# Patient Record
Sex: Male | Born: 1955 | Race: White | Hispanic: No | Marital: Single | Smoking: Current every day smoker
Health system: Southern US, Community
[De-identification: ages and names within clinical notes are randomized; demographics above are authoritative.]

## PROBLEM LIST (undated history)

## (undated) DIAGNOSIS — F32A Depression, unspecified: Secondary | ICD-10-CM

## (undated) DIAGNOSIS — I639 Cerebral infarction, unspecified: Secondary | ICD-10-CM

## (undated) DIAGNOSIS — E785 Hyperlipidemia, unspecified: Secondary | ICD-10-CM

## (undated) DIAGNOSIS — F329 Major depressive disorder, single episode, unspecified: Secondary | ICD-10-CM

## (undated) DIAGNOSIS — J449 Chronic obstructive pulmonary disease, unspecified: Secondary | ICD-10-CM

## (undated) DIAGNOSIS — M549 Dorsalgia, unspecified: Secondary | ICD-10-CM

## (undated) DIAGNOSIS — R1310 Dysphagia, unspecified: Secondary | ICD-10-CM

## (undated) DIAGNOSIS — I10 Essential (primary) hypertension: Secondary | ICD-10-CM

## (undated) DIAGNOSIS — R269 Unspecified abnormalities of gait and mobility: Secondary | ICD-10-CM

## (undated) DIAGNOSIS — F0390 Unspecified dementia without behavioral disturbance: Secondary | ICD-10-CM

## (undated) HISTORY — PX: CHOLECYSTECTOMY: SHX55

---

## 2001-12-02 ENCOUNTER — Encounter: Payer: Self-pay | Admitting: Preventative Medicine

## 2001-12-02 ENCOUNTER — Ambulatory Visit (HOSPITAL_COMMUNITY): Admission: RE | Admit: 2001-12-02 | Discharge: 2001-12-02 | Payer: Self-pay | Admitting: Preventative Medicine

## 2003-04-11 ENCOUNTER — Emergency Department (HOSPITAL_COMMUNITY): Admission: EM | Admit: 2003-04-11 | Discharge: 2003-04-11 | Payer: Self-pay | Admitting: Internal Medicine

## 2004-11-20 ENCOUNTER — Ambulatory Visit (HOSPITAL_COMMUNITY): Admission: RE | Admit: 2004-11-20 | Discharge: 2004-11-20 | Payer: Self-pay | Admitting: Unknown Physician Specialty

## 2005-03-31 ENCOUNTER — Emergency Department (HOSPITAL_COMMUNITY): Admission: EM | Admit: 2005-03-31 | Discharge: 2005-03-31 | Payer: Self-pay | Admitting: Emergency Medicine

## 2006-10-02 ENCOUNTER — Ambulatory Visit (HOSPITAL_COMMUNITY): Admission: RE | Admit: 2006-10-02 | Discharge: 2006-10-02 | Payer: Self-pay | Admitting: Emergency Medicine

## 2012-11-10 ENCOUNTER — Other Ambulatory Visit: Payer: Self-pay | Admitting: Neurology

## 2012-11-10 DIAGNOSIS — R270 Ataxia, unspecified: Secondary | ICD-10-CM

## 2012-11-10 DIAGNOSIS — M4712 Other spondylosis with myelopathy, cervical region: Secondary | ICD-10-CM

## 2012-11-11 ENCOUNTER — Ambulatory Visit (HOSPITAL_COMMUNITY)
Admission: RE | Admit: 2012-11-11 | Discharge: 2012-11-11 | Disposition: A | Discharge status: Home / Self Care | Payer: Self-pay | Source: Ambulatory Visit | Attending: Neurology | Admitting: Neurology

## 2012-11-11 ENCOUNTER — Other Ambulatory Visit: Payer: Self-pay | Admitting: Neurology

## 2012-11-11 DIAGNOSIS — M546 Pain in thoracic spine: Secondary | ICD-10-CM | POA: Insufficient documentation

## 2012-11-11 DIAGNOSIS — M545 Low back pain, unspecified: Secondary | ICD-10-CM | POA: Insufficient documentation

## 2012-11-24 ENCOUNTER — Ambulatory Visit (HOSPITAL_COMMUNITY): Payer: Self-pay

## 2012-11-25 ENCOUNTER — Ambulatory Visit (HOSPITAL_COMMUNITY): Payer: Self-pay

## 2013-01-06 ENCOUNTER — Ambulatory Visit (HOSPITAL_COMMUNITY)
Admission: RE | Admit: 2013-01-06 | Discharge: 2013-01-06 | Disposition: A | Discharge status: Home / Self Care | Payer: Medicaid Other | Source: Ambulatory Visit | Attending: Neurology | Admitting: Neurology

## 2013-01-06 DIAGNOSIS — M542 Cervicalgia: Secondary | ICD-10-CM | POA: Diagnosis present

## 2013-01-06 DIAGNOSIS — M4802 Spinal stenosis, cervical region: Secondary | ICD-10-CM | POA: Insufficient documentation

## 2013-01-06 DIAGNOSIS — R270 Ataxia, unspecified: Secondary | ICD-10-CM

## 2013-01-06 DIAGNOSIS — M5380 Other specified dorsopathies, site unspecified: Secondary | ICD-10-CM | POA: Insufficient documentation

## 2013-01-06 DIAGNOSIS — M4712 Other spondylosis with myelopathy, cervical region: Secondary | ICD-10-CM

## 2013-05-03 ENCOUNTER — Encounter (HOSPITAL_COMMUNITY): Payer: Self-pay | Admitting: Emergency Medicine

## 2013-05-03 ENCOUNTER — Emergency Department (HOSPITAL_COMMUNITY)
Admission: EM | Admit: 2013-05-03 | Discharge: 2013-05-03 | Disposition: A | Discharge status: Home / Self Care | Payer: Medicare Other | Attending: Emergency Medicine | Admitting: Emergency Medicine

## 2013-05-03 ENCOUNTER — Emergency Department (HOSPITAL_COMMUNITY): Payer: Medicare Other

## 2013-05-03 DIAGNOSIS — M545 Low back pain, unspecified: Secondary | ICD-10-CM

## 2013-05-03 DIAGNOSIS — W19XXXA Unspecified fall, initial encounter: Secondary | ICD-10-CM | POA: Diagnosis not present

## 2013-05-03 DIAGNOSIS — F17200 Nicotine dependence, unspecified, uncomplicated: Secondary | ICD-10-CM | POA: Insufficient documentation

## 2013-05-03 DIAGNOSIS — S7000XA Contusion of unspecified hip, initial encounter: Secondary | ICD-10-CM | POA: Diagnosis not present

## 2013-05-03 DIAGNOSIS — IMO0002 Reserved for concepts with insufficient information to code with codable children: Secondary | ICD-10-CM | POA: Diagnosis present

## 2013-05-03 DIAGNOSIS — Y929 Unspecified place or not applicable: Secondary | ICD-10-CM | POA: Insufficient documentation

## 2013-05-03 DIAGNOSIS — S20229A Contusion of unspecified back wall of thorax, initial encounter: Secondary | ICD-10-CM | POA: Insufficient documentation

## 2013-05-03 DIAGNOSIS — Y939 Activity, unspecified: Secondary | ICD-10-CM | POA: Insufficient documentation

## 2013-05-03 HISTORY — DX: Dorsalgia, unspecified: M54.9

## 2013-05-03 MED ORDER — MELOXICAM 7.5 MG PO TABS: ORAL_TABLET | ORAL | Status: DC

## 2013-05-03 MED ORDER — BACLOFEN 10 MG PO TABS
10.0000 mg | ORAL_TABLET | Freq: Three times a day (TID) | ORAL | Status: AC
Start: 2013-05-03 — End: 2013-06-02

## 2013-05-03 MED ORDER — DEXAMETHASONE SODIUM PHOSPHATE 4 MG/ML IJ SOLN
8.0000 mg | Freq: Once | INTRAMUSCULAR | Status: AC
  Administered 2013-05-03: 8 mg via INTRAMUSCULAR
  Filled 2013-05-03: qty 2

## 2013-05-03 MED ORDER — DEXAMETHASONE 4 MG PO TABS: ORAL_TABLET | ORAL | Status: DC

## 2013-05-03 MED ORDER — KETOROLAC TROMETHAMINE 60 MG/2ML IM SOLN
60.0000 mg | Freq: Once | INTRAMUSCULAR | Status: AC
  Administered 2013-05-03: 60 mg via INTRAMUSCULAR
  Filled 2013-05-03: qty 2

## 2013-05-03 NOTE — ED Provider Notes (Signed)
CSN: [id number]     Arrival date & time 05/03/13  1633 History   First MD Initiated Contact with Patient 05/03/13 1814     Chief Complaint  Patient presents with  . Back Pain   (Consider location/radiation/quality/duration/timing/severity/associated sxs/prior Treatment) Patient is a 58 y.o. male presenting with back pain. The history is provided by the patient. 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  Back Pain Location:  Lumbar spine Quality:  Aching Pain severity:  Moderate Pain is:  Same all the time Onset quality:  Sudden Duration:  1 day Timing:  Unable to specify Progression:  Worsening Chronicity:  Chronic Context: falling and recent injury   Context comment:  Pt unable to explain Relieved by:  Nothing Worsened by:  Movement Associated symptoms: no abdominal pain, no bladder incontinence, no bowel incontinence, no chest pain, no dysuria and no fever     Past Medical History  Diagnosis Date  . Back pain    Past Surgical History  Procedure Laterality Date  . Cholecystectomy     No family history on file. History  Substance Use Topics  . Smoking status: Current Every Day Smoker  . Smokeless tobacco: Not on file  . Alcohol Use: No    Review of Systems  Constitutional: Negative for fever and activity change.       All ROS Neg except as noted in HPI  HENT: Negative for nosebleeds.   Eyes: Negative for photophobia and discharge.  Respiratory: Negative for cough, shortness of breath and wheezing.   Cardiovascular: Negative for chest pain and palpitations.  Gastrointestinal: Negative for abdominal pain, blood in stool and bowel incontinence.  Genitourinary: Negative for bladder incontinence, dysuria, frequency and hematuria.  Musculoskeletal: Positive for back pain. Negative for arthralgias and neck pain.  Skin: Negative.   Neurological: Negative for dizziness, seizures and speech difficulty.  Psychiatric/Behavioral: Negative for hallucinations and confusion.     Allergies  Review of patient's allergies indicates no known allergies.  Home Medications  No current outpatient prescriptions on file. BP 132/75  Pulse 78  Temp(Src) 98.4 F (36.9 C)  Resp 20  Ht 5\' 8"  (1.727 m)  Wt 141 lb (63.957 kg)  BMI 21.44 kg/m2  SpO2 98% Physical Exam  Nursing note and vitals reviewed. Constitutional: He is oriented to person, place, and time. He appears well-developed and well-nourished.  Non-toxic appearance.  HENT:  Head: Normocephalic.  Right Ear: Tympanic membrane and external ear normal.  Left Ear: Tympanic membrane and external ear normal.  Eyes: EOM and lids are normal. Pupils are equal, round, and reactive to light.  Neck: Normal range of motion. Neck supple. Carotid bruit is not present.  Cardiovascular: Normal rate, regular rhythm, normal heart sounds, intact distal pulses and normal pulses.   Pulmonary/Chest: Breath sounds normal. No respiratory distress.  Abdominal: Soft. Bowel sounds are normal. There is no tenderness. There is no guarding.  Musculoskeletal: He exhibits tenderness.       Lumbar back: He exhibits decreased range of motion, tenderness and pain.  Bruise to the right pack and hip area.   Lymphadenopathy:       Head (right side): No submandibular adenopathy present.       Head (left side): No submandibular adenopathy present.    He has no cervical adenopathy.  Neurological: He is alert and oriented to person, place, and time. No cranial nerve deficit or sensory deficit.  Slight decrease in motor tone of the lower extremities. Pt states this is not new.  Skin: Skin  is warm and dry.  Psychiatric: He has a normal mood and affect. His speech is normal.    ED Course  Procedures (including critical care time) Labs Review Labs Reviewed - No data to display Imaging Review No results found.  EKG Interpretation   None       MDM  No diagnosis found. **I have reviewed nursing notes, vital signs, and all appropriate lab  and imaging results for this patient.* Vital signs are stable. Pulse oximetry is 98% on room air. Patient has a speech impediment, and it is very difficult to understand him. The patient states however that he has been having back pain since yesterday. He's had this problem in the past.  No gross neurologic deficit appreciated. The patient walks with a cane, but this is not new.Bruise noted on the lowr=er back. Xray of the back and pelvis is neg for fx.Mild degenerative changes of the L4L5 area noted. Patient is treated with Decadron and Toradol in the emergency department. he's treated with baclofen, Decadro An, Mobic prescriptions. Patient is advised to see his physician for additional evaluation and management.   Kathie DikeHobson M Samhitha Rosen, PA-C 05/03/13 1837  Kathie DikeHobson M Solomon Skowronek, PA-C 05/06/13 1121

## 2013-05-03 NOTE — ED Notes (Signed)
Back pain onset yesterday. Patient is very difficult to understand due to problems with speech

## 2013-05-03 NOTE — Discharge Instructions (Signed)
Please call your doctor and set up an appointment for recheck. Please use medications as directed. Baclofen may cause drowsiness, please use with caution. Please take Decadron and Mobic with food. A heating pad to your lower back maybe helpful. Back Pain, Adult Back pain is very common. The pain often gets better over time. The cause of back pain is usually not dangerous. Most people can learn to manage their back pain on their own.  HOME CARE   Stay active. Start with short walks on flat ground if you can. Try to walk farther each day.  Do not sit, drive, or stand in one place for more than 30 minutes. Do not stay in bed.  Do not avoid exercise or work. Activity can help your back heal faster.  Be careful when you bend or lift an object. Bend at your knees, keep the object close to you, and do not twist.  Sleep on a firm mattress. Lie on your side, and bend your knees. If you lie on your back, put a pillow under your knees.  Only take medicines as told by your doctor.  Put ice on the injured area.  Put ice in a plastic bag.  Place a towel between your skin and the bag.  Leave the ice on for 15-20 minutes, 03-04 times a day for the first 2 to 3 days. After that, you can switch between ice and heat packs.  Ask your doctor about back exercises or massage.  Avoid feeling anxious or stressed. Find good ways to deal with stress, such as exercise. GET HELP RIGHT AWAY IF:   Your pain does not go away with rest or medicine.  Your pain does not go away in 1 week.  You have new problems.  You do not feel well.  The pain spreads into your legs.  You cannot control when you poop (bowel movement) or pee (urinate).  Your arms or legs feel weak or lose feeling (numbness).  You feel sick to your stomach (nauseous) or throw up (vomit).  You have belly (abdominal) pain.  You feel like you may pass out (faint). MAKE SURE YOU:   Understand these instructions.  Will watch your  condition.  Will get help right away if you are not doing well or get worse. Document Released: 09/17/2007 Document Revised: 06/23/2011 Document Reviewed: 08/19/2010 [facility] Patient Information 2014 [employer], [place].

## 2013-05-03 NOTE — ED Notes (Signed)
Pain  Low back , has contusions to rt buttock  And rt upper post thigh,  Says he fell

## 2013-05-06 NOTE — ED Provider Notes (Signed)
Medical screening examination/treatment/procedure(s) were performed by non-physician practitioner and as supervising physician I was immediately available for consultation/collaboration.  EKG Interpretation   None         Roselyn Doby L Pellegrino Kennard, MD 05/06/13 1506 

## 2013-09-29 ENCOUNTER — Emergency Department (HOSPITAL_COMMUNITY)
Admission: EM | Admit: 2013-09-29 | Discharge: 2013-09-29 | Disposition: A | Discharge status: Skilled Nursing Facility | Payer: Medicare Other | Attending: Emergency Medicine | Admitting: Emergency Medicine

## 2013-09-29 ENCOUNTER — Encounter (HOSPITAL_COMMUNITY): Payer: Self-pay | Admitting: Emergency Medicine

## 2013-09-29 ENCOUNTER — Emergency Department (HOSPITAL_COMMUNITY): Payer: Medicare Other

## 2013-09-29 DIAGNOSIS — W19XXXA Unspecified fall, initial encounter: Secondary | ICD-10-CM

## 2013-09-29 DIAGNOSIS — S0181XA Laceration without foreign body of other part of head, initial encounter: Secondary | ICD-10-CM

## 2013-09-29 DIAGNOSIS — Z79899 Other long term (current) drug therapy: Secondary | ICD-10-CM | POA: Diagnosis not present

## 2013-09-29 DIAGNOSIS — Z23 Encounter for immunization: Secondary | ICD-10-CM | POA: Insufficient documentation

## 2013-09-29 DIAGNOSIS — E785 Hyperlipidemia, unspecified: Secondary | ICD-10-CM | POA: Insufficient documentation

## 2013-09-29 DIAGNOSIS — F17200 Nicotine dependence, unspecified, uncomplicated: Secondary | ICD-10-CM | POA: Diagnosis not present

## 2013-09-29 DIAGNOSIS — S1093XA Contusion of unspecified part of neck, initial encounter: Secondary | ICD-10-CM

## 2013-09-29 DIAGNOSIS — Y9389 Activity, other specified: Secondary | ICD-10-CM | POA: Insufficient documentation

## 2013-09-29 DIAGNOSIS — S0003XA Contusion of scalp, initial encounter: Secondary | ICD-10-CM | POA: Insufficient documentation

## 2013-09-29 DIAGNOSIS — Y9210 Unspecified residential institution as the place of occurrence of the external cause: Secondary | ICD-10-CM | POA: Insufficient documentation

## 2013-09-29 DIAGNOSIS — Z8673 Personal history of transient ischemic attack (TIA), and cerebral infarction without residual deficits: Secondary | ICD-10-CM | POA: Diagnosis not present

## 2013-09-29 DIAGNOSIS — F0390 Unspecified dementia without behavioral disturbance: Secondary | ICD-10-CM | POA: Diagnosis not present

## 2013-09-29 DIAGNOSIS — Y92129 Unspecified place in nursing home as the place of occurrence of the external cause: Secondary | ICD-10-CM

## 2013-09-29 DIAGNOSIS — Z8659 Personal history of other mental and behavioral disorders: Secondary | ICD-10-CM | POA: Insufficient documentation

## 2013-09-29 DIAGNOSIS — R296 Repeated falls: Secondary | ICD-10-CM | POA: Diagnosis not present

## 2013-09-29 DIAGNOSIS — IMO0002 Reserved for concepts with insufficient information to code with codable children: Secondary | ICD-10-CM | POA: Diagnosis not present

## 2013-09-29 DIAGNOSIS — S0180XA Unspecified open wound of other part of head, initial encounter: Secondary | ICD-10-CM | POA: Insufficient documentation

## 2013-09-29 DIAGNOSIS — J449 Chronic obstructive pulmonary disease, unspecified: Secondary | ICD-10-CM | POA: Insufficient documentation

## 2013-09-29 DIAGNOSIS — S0083XA Contusion of other part of head, initial encounter: Secondary | ICD-10-CM | POA: Insufficient documentation

## 2013-09-29 DIAGNOSIS — J4489 Other specified chronic obstructive pulmonary disease: Secondary | ICD-10-CM | POA: Insufficient documentation

## 2013-09-29 HISTORY — DX: Unspecified dementia, unspecified severity, without behavioral disturbance, psychotic disturbance, mood disturbance, and anxiety: F03.90

## 2013-09-29 HISTORY — DX: Dysphagia, unspecified: R13.10

## 2013-09-29 HISTORY — DX: Cerebral infarction, unspecified: I63.9

## 2013-09-29 HISTORY — DX: Hyperlipidemia, unspecified: E78.5

## 2013-09-29 HISTORY — DX: Depression, unspecified: F32.A

## 2013-09-29 HISTORY — DX: Major depressive disorder, single episode, unspecified: F32.9

## 2013-09-29 HISTORY — DX: Unspecified abnormalities of gait and mobility: R26.9

## 2013-09-29 HISTORY — DX: Chronic obstructive pulmonary disease, unspecified: J44.9

## 2013-09-29 MED ORDER — TETANUS-DIPHTH-ACELL PERTUSSIS 5-2.5-18.5 LF-MCG/0.5 IM SUSP
0.5000 mL | Freq: Once | INTRAMUSCULAR | Status: AC
  Administered 2013-09-29: 0.5 mL via INTRAMUSCULAR
  Filled 2013-09-29: qty 0.5

## 2013-09-29 NOTE — ED Provider Notes (Signed)
  Medical screening examination/treatment/procedure(s) were performed by non-physician practitioner and as supervising physician I was immediately available for consultation/collaboration.   EKG Interpretation None            Gerhard Munchobert Lockwood, MD 09/29/13 1404

## 2013-09-29 NOTE — ED Notes (Addendum)
Pt. From [facility], got upset, threw walker, and fell, no LOC, laceration to right temple, no active bleeding,

## 2013-09-29 NOTE — ED Provider Notes (Signed)
CSN: [id number]     Arrival date & time 09/29/13  0941 History   First MD Initiated Contact with Patient 09/29/13 [phone]     Chief Complaint  Patient presents with  . Fall     (Consider location/radiation/quality/duration/timing/severity/associated sxs/prior Treatment) Patient is a 58 y.o. male presenting with fall. The history is provided by the EMS personnel, a caregiver and the patient. The history is limited by a language barrier (patient has had stroke and limited verbal communication).  Fall This is a new problem. The current episode started today. The problem has been unchanged. Nothing aggravates the symptoms. He has tried nothing for the symptoms.   Jason Cisneros is a 58 y.o. male who presents to the ED from [facility] after staff there state he got upset and threw his walker and fell. The patient says that the aid got in his way and caused him to fall.  He ha a laceration to the right temple and contusion to the right cheek. No LOC.  He denies any other injuries. Hx of CVA and speech is slurred but he does communicate using lap top. The nursing personnel with the patient states that the patient has a shuffle gait and when he gets upset it gets much worse. He was ambulatory after the fall.   Past Medical History  Diagnosis Date  . Back pain   . Dementia   . Hyperlipidemia   . Gait disturbance   . Depressive disorder    Past Surgical History  Procedure Laterality Date  . Cholecystectomy     History reviewed. No pertinent family history. History  Substance Use Topics  . Smoking status: Current Every Day Smoker -- 0.10 packs/day    Types: Cigarettes  . Smokeless tobacco: Not on file  . Alcohol Use: No    Review of Systems Negative except as stated in HPI   Allergies  Review of patient's allergies indicates no known allergies.  Home Medications   Prior to Admission medications   Medication Sig Start Date End Date Taking? Authorizing Provider  dexamethasone (DECADRON) 4  MG tablet 1 po bid with food 05/03/13   Kathie DikeHobson M Bryant, PA-C  lisinopril (PRINIVIL,ZESTRIL) 20 MG tablet Take 1 tablet by mouth daily. 04/16/13   Historical Provider, MD  meloxicam (MOBIC) 7.5 MG tablet 1 po bid with food 05/03/13   Kathie DikeHobson M Bryant, PA-C  pravastatin (PRAVACHOL) 40 MG tablet Take 1 tablet by mouth daily. 04/16/13   Historical Provider, MD  SYMBICORT 80-4.5 MCG/ACT inhaler Inhale 2 puffs into the lungs 2 (two) times daily.  04/16/13   Historical Provider, MD   BP 163/84  Pulse 99  Temp(Src) 98.6 F (37 C) (Oral)  Resp 36  SpO2 99% Physical Exam  Nursing note and vitals reviewed. Constitutional: He is oriented to person, place, and time. He appears well-developed and well-nourished. No distress.  HENT:  Head:    Right Ear: Tympanic membrane normal.  Left Ear: Tympanic membrane normal.  Mouth/Throat: Uvula is midline, oropharynx is clear and moist and mucous membranes are normal.  Contusion to right cheek.  Eyes: Conjunctivae and EOM are normal. Pupils are equal, round, and reactive to light.  Neck: Normal range of motion. Neck supple.  Pulmonary/Chest: Effort normal.  Abdominal: Soft. There is no tenderness.  Musculoskeletal: Normal range of motion.  Neurological: He is alert and oriented to person, place, and time. He has normal strength. No cranial nerve deficit or sensory deficit. Gait (but baseline for the patient) abnormal.  Good grips and equal bilateral.  Skin: Skin is warm and dry.  Psychiatric: He has a normal mood and affect. His behavior is normal.    ED Course  Procedures  LACERATION REPAIR Performed by: NEESE,HOPE Authorized by: NEESE,HOPE Consent: Verbal consent obtained. Risks and benefits: risks, benefits and alternatives were discussed Consent given by: patient  Patient identity confirmed: provided demographic data  Wound explored  Laceration Location: right temporal area  Laceration Length: 1.5 cm  No Foreign Bodies seen or  palpated  Irrigation method: syringe Amount of cleaning: standard  Skin closure: Dermabond  Patient tolerance: Patient tolerated the procedure well with no immediate complications.   Ct Maxillofacial Wo Cm  09/29/2013   CLINICAL DATA:  Fall, no loss of consciousness, RIGHT temporal laceration, history COPD, stroke, dementia  EXAM: CT MAXILLOFACIAL WITHOUT CONTRAST  TECHNIQUE: Multidetector CT imaging of the maxillofacial structures was performed. Multiplanar CT image reconstructions were also generated. A small metallic BB was placed on the right temple in order to reliably differentiate right from left.  COMPARISON:  08/02/2013 CT head  FINDINGS: Generalized atrophy.  Normal ventricular morphology.  No midline shift or mass effect.  Small old high RIGHT parietal infarct.  Scattered atherosclerotic calcifications.  Prior RIGHT mastoid surgery.  LEFT middle ear cavity clear.  Remaining paranasal sinuses clear.  Nasal septal deviation to the RIGHT.  No facial bone fractures identified.  Intraorbital soft tissue planes clear.  IMPRESSION: No facial bone fractures identified.  Small old RIGHT parietal infarct.   Electronically Signed   By: Ulyses SouthwardMark  Boles M.D.   On: 09/29/2013 11:21     MDM  58 y.o. male with laceration and contusion to the right side of his face s/p fall. Stable for discharge without any new neurological deficits. Chronic COPD, demintia, gait disturbance, CVA and dysphagia. He is here with nursing staff from the nursing home. Discussed plan of care with care giver. They will return for any problems.     Janne NapoleonHope M Neese, [place] 09/29/13 1235

## 2013-12-10 ENCOUNTER — Encounter (HOSPITAL_COMMUNITY): Payer: Self-pay | Admitting: Emergency Medicine

## 2013-12-10 ENCOUNTER — Emergency Department (HOSPITAL_COMMUNITY)
Admission: EM | Admit: 2013-12-10 | Discharge: 2013-12-10 | Disposition: A | Discharge status: Home / Self Care | Payer: Medicare Other | Attending: Emergency Medicine | Admitting: Emergency Medicine

## 2013-12-10 DIAGNOSIS — Z79899 Other long term (current) drug therapy: Secondary | ICD-10-CM | POA: Diagnosis not present

## 2013-12-10 DIAGNOSIS — IMO0002 Reserved for concepts with insufficient information to code with codable children: Secondary | ICD-10-CM | POA: Diagnosis not present

## 2013-12-10 DIAGNOSIS — J4489 Other specified chronic obstructive pulmonary disease: Secondary | ICD-10-CM | POA: Insufficient documentation

## 2013-12-10 DIAGNOSIS — Y9389 Activity, other specified: Secondary | ICD-10-CM | POA: Insufficient documentation

## 2013-12-10 DIAGNOSIS — F17200 Nicotine dependence, unspecified, uncomplicated: Secondary | ICD-10-CM | POA: Insufficient documentation

## 2013-12-10 DIAGNOSIS — T189XXA Foreign body of alimentary tract, part unspecified, initial encounter: Secondary | ICD-10-CM | POA: Insufficient documentation

## 2013-12-10 DIAGNOSIS — E785 Hyperlipidemia, unspecified: Secondary | ICD-10-CM | POA: Diagnosis not present

## 2013-12-10 DIAGNOSIS — F329 Major depressive disorder, single episode, unspecified: Secondary | ICD-10-CM | POA: Diagnosis not present

## 2013-12-10 DIAGNOSIS — Z7982 Long term (current) use of aspirin: Secondary | ICD-10-CM | POA: Diagnosis not present

## 2013-12-10 DIAGNOSIS — F0390 Unspecified dementia without behavioral disturbance: Secondary | ICD-10-CM | POA: Insufficient documentation

## 2013-12-10 DIAGNOSIS — Z791 Long term (current) use of non-steroidal anti-inflammatories (NSAID): Secondary | ICD-10-CM | POA: Diagnosis not present

## 2013-12-10 DIAGNOSIS — Z8679 Personal history of other diseases of the circulatory system: Secondary | ICD-10-CM | POA: Diagnosis not present

## 2013-12-10 DIAGNOSIS — F3289 Other specified depressive episodes: Secondary | ICD-10-CM | POA: Insufficient documentation

## 2013-12-10 DIAGNOSIS — Y929 Unspecified place or not applicable: Secondary | ICD-10-CM | POA: Diagnosis not present

## 2013-12-10 DIAGNOSIS — J449 Chronic obstructive pulmonary disease, unspecified: Secondary | ICD-10-CM | POA: Insufficient documentation

## 2013-12-10 LAB — PROTIME-INR
INR: 1.08 (ref 0.00–1.49)
Prothrombin Time: 14 seconds (ref 11.6–15.2)

## 2013-12-10 MED ORDER — IPRATROPIUM-ALBUTEROL 0.5-2.5 (3) MG/3ML IN SOLN
3.0000 mL | Freq: Once | RESPIRATORY_TRACT | Status: AC
  Administered 2013-12-10: 3 mL via RESPIRATORY_TRACT
  Filled 2013-12-10: qty 3

## 2013-12-10 NOTE — ED Provider Notes (Signed)
CSN: [id number]     Arrival date & time    History   First MD Initiated Contact with Patient 12/10/13 2207     Chief Complaint  Patient presents with  . Poisoning     (Consider location/radiation/quality/duration/timing/severity/associated sxs/prior Treatment) HPI Bay Wayson Nokes is a 58 y.o. male who presents to the ED from [facility] after staff there was concerned that the patient may have eaten a rat at approximately 9 am. The staff received the report from house keeping that the patient had something in his mouth that looked like it had a tail. Patient denies eating a rat. He states that it was a piece of bread. The night shift at [facility] decided to send the patient to the ED for coagulation levels to be safe. The staff person here with the patient reports that the patient is acting his usual self.   Past Medical History  Diagnosis Date  . Back pain   . Dementia   . Hyperlipidemia   . Gait disturbance   . Depressive disorder   . COPD (chronic obstructive pulmonary disease)   . Dysphagia   . CVA (cerebral infarction)    Past Surgical History  Procedure Laterality Date  . Cholecystectomy     No family history on file. History  Substance Use Topics  . Smoking status: Current Every Day Smoker -- 0.10 packs/day    Types: Cigarettes  . Smokeless tobacco: Not on file  . Alcohol Use: No    Review of Systems Negative except as stated in HPI   Allergies  Review of patient's allergies indicates no known allergies.  Home Medications   Prior to Admission medications   Medication Sig Start Date End Date Taking? Authorizing Provider  amLODipine (NORVASC) 5 MG tablet Take 1 tablet by mouth daily. 07/25/13  Yes Historical Provider, MD  aspirin EC 81 MG tablet Take 81 mg by mouth daily. For status post cva.   Yes Historical Provider, MD  atorvastatin (LIPITOR) 20 MG tablet Take 20 mg by mouth every evening.   Yes Historical Provider, MD  baclofen (LIORESAL) 20 MG tablet Take 20 mg by  mouth 2 (two) times daily. For gait disturbances,muscle spasm   Yes Historical Provider, MD  cholecalciferol (VITAMIN D) 1000 UNITS tablet Take 1,000 Units by mouth daily.   Yes Historical Provider, MD  ipratropium-albuterol (DUONEB) 0.5-2.5 (3) MG/3ML SOLN Take 3 mLs by nebulization 3 (three) times daily. For COPD.   Yes Historical Provider, MD  lisinopril (PRINIVIL,ZESTRIL) 20 MG tablet Take 1 tablet by mouth daily. 04/16/13  Yes Historical Provider, MD  LORazepam (ATIVAN) 0.5 MG tablet Take 0.5 mg by mouth every 8 (eight) hours as needed for anxiety.   Yes Historical Provider, MD  mirtazapine (REMERON) 30 MG tablet Take 30 mg by mouth at bedtime.   Yes Historical Provider, MD  OXcarbazepine (TRILEPTAL) 150 MG tablet Take 150 mg by mouth 2 (two) times daily.   Yes Historical Provider, MD  tiotropium (SPIRIVA) 18 MCG inhalation capsule Place 18 mcg into inhaler and inhale daily.   Yes Historical Provider, MD  ipratropium-albuterol (DUONEB) 0.5-2.5 (3) MG/3ML SOLN Take 3 mLs by nebulization every 6 (six) hours as needed (for shortness of breath or wheezing.).    Historical Provider, MD  meloxicam (MOBIC) 15 MG tablet Take 15 mg by mouth 2 (two) times daily. For gout.    Historical Provider, MD   BP 156/87  Pulse 77  Temp(Src) 98.6 F (37 C) (Oral)  Resp 20  Ht  (1.727 m)  Wt 133 lb 4.8 oz (60.464 kg)  BMI 20.27 kg/m2  SpO2 94% Physical Exam  Nursing note and vitals reviewed. Constitutional: He is oriented to person, place, and time. He appears well-developed and well-nourished. No distress.  HENT:  Head: Normocephalic and atraumatic.  Eyes: EOM are normal.  Neck: Neck supple.  Cardiovascular: Normal rate and regular rhythm.   Pulmonary/Chest: Effort normal. No respiratory distress.  Abdominal: Soft. Bowel sounds are normal. There is no tenderness.  Musculoskeletal: Normal range of motion.  Neurological: He is alert and oriented to person, place, and time. No cranial nerve deficit.   Skin: Skin is warm and dry.    ED Course  Procedures (including critical care time) Labs Review Labs Reviewed  PROTIME-INR   Results for orders placed during the hospital encounter of 12/10/13 (from the past 24 hour(s))  PROTIME-INR     Status: None   Collection Time    12/10/13  9:20 PM      Result Value Ref Range   Prothrombin Time 14.0  11.6 - 15.2 seconds   INR 1.08  0.00 - 1.49      MDM  58 y.o. male brought to the ED after possible ingesting a rat. Patient has been his normal base line since the possible ingestion per staff from [facility] with the patient. Stable for discharge without any acute findings, normal PT. Will d/c home.  Discussed with the staff member from [facility] findings and plan of care. She voices understanding. All questioned fully answered. He will return if any problems arise.     Janne Napoleon, [place] 12/10/13 2251

## 2013-12-10 NOTE — Discharge Instructions (Signed)
The lab work tonight was normal.  Results for orders placed during the hospital encounter of 12/10/13 (from the past 24 hour(s))  PROTIME-INR     Status: None   Collection Time    12/10/13  9:20 PM      Result Value Ref Range   Prothrombin Time 14.0  11.6 - 15.2 seconds   INR 1.08  0.00 - 1.49   if the patient develops any problems return to the ED.

## 2013-12-10 NOTE — ED Notes (Signed)
Morning staff @ [facility] concerned that patient may have eaten a mouse @ 9am. This is  rumor at this point,  Pt denies same. Night shift staff decided to send him in for coagulation levels to be safe

## 2013-12-11 NOTE — ED Provider Notes (Signed)
Medical screening examination/treatment/procedure(s) were performed by non-physician practitioner and as supervising physician I was immediately available for consultation/collaboration.   EKG Interpretation None        Marjie Chea L Ollivander See, MD 12/11/13 1737 

## 2013-12-26 ENCOUNTER — Encounter (HOSPITAL_COMMUNITY): Payer: Self-pay | Admitting: Emergency Medicine

## 2013-12-26 ENCOUNTER — Emergency Department (HOSPITAL_COMMUNITY)
Admission: EM | Admit: 2013-12-26 | Discharge: 2013-12-27 | Disposition: A | Discharge status: Home / Self Care | Payer: Medicare Other | Attending: Emergency Medicine | Admitting: Emergency Medicine

## 2013-12-26 DIAGNOSIS — E785 Hyperlipidemia, unspecified: Secondary | ICD-10-CM | POA: Diagnosis not present

## 2013-12-26 DIAGNOSIS — Y9289 Other specified places as the place of occurrence of the external cause: Secondary | ICD-10-CM | POA: Insufficient documentation

## 2013-12-26 DIAGNOSIS — F3289 Other specified depressive episodes: Secondary | ICD-10-CM | POA: Insufficient documentation

## 2013-12-26 DIAGNOSIS — Z791 Long term (current) use of non-steroidal anti-inflammatories (NSAID): Secondary | ICD-10-CM | POA: Diagnosis not present

## 2013-12-26 DIAGNOSIS — J4489 Other specified chronic obstructive pulmonary disease: Secondary | ICD-10-CM | POA: Insufficient documentation

## 2013-12-26 DIAGNOSIS — Z7982 Long term (current) use of aspirin: Secondary | ICD-10-CM | POA: Diagnosis not present

## 2013-12-26 DIAGNOSIS — Y9389 Activity, other specified: Secondary | ICD-10-CM | POA: Diagnosis not present

## 2013-12-26 DIAGNOSIS — S0993XA Unspecified injury of face, initial encounter: Secondary | ICD-10-CM | POA: Diagnosis present

## 2013-12-26 DIAGNOSIS — J449 Chronic obstructive pulmonary disease, unspecified: Secondary | ICD-10-CM | POA: Insufficient documentation

## 2013-12-26 DIAGNOSIS — W010XXA Fall on same level from slipping, tripping and stumbling without subsequent striking against object, initial encounter: Secondary | ICD-10-CM | POA: Insufficient documentation

## 2013-12-26 DIAGNOSIS — Z8673 Personal history of transient ischemic attack (TIA), and cerebral infarction without residual deficits: Secondary | ICD-10-CM | POA: Insufficient documentation

## 2013-12-26 DIAGNOSIS — S01112A Laceration without foreign body of left eyelid and periocular area, initial encounter: Secondary | ICD-10-CM

## 2013-12-26 DIAGNOSIS — Z79899 Other long term (current) drug therapy: Secondary | ICD-10-CM | POA: Diagnosis not present

## 2013-12-26 DIAGNOSIS — F329 Major depressive disorder, single episode, unspecified: Secondary | ICD-10-CM | POA: Diagnosis not present

## 2013-12-26 DIAGNOSIS — F0390 Unspecified dementia without behavioral disturbance: Secondary | ICD-10-CM | POA: Insufficient documentation

## 2013-12-26 DIAGNOSIS — S199XXA Unspecified injury of neck, initial encounter: Secondary | ICD-10-CM

## 2013-12-26 DIAGNOSIS — S0180XA Unspecified open wound of other part of head, initial encounter: Secondary | ICD-10-CM | POA: Diagnosis not present

## 2013-12-26 DIAGNOSIS — F17200 Nicotine dependence, unspecified, uncomplicated: Secondary | ICD-10-CM | POA: Insufficient documentation

## 2013-12-26 NOTE — ED Provider Notes (Signed)
CSN: [id number]     Arrival date & time    History   First MD Initiated Contact with Patient 12/26/13 2339     This chart was scribed for Donnetta Hutching, MD by Tonye Royalty, ED Scribe. This patient was seen in room APA14/APA14 and the patient's care was started at 11:42 PM.   Chief Complaint  Patient presents with  . Fall   The history is provided by the patient. The history is limited by the condition of the patient. No language interpreter was used.    HPI Comments: Level V caveat secondary to patient being nonverbal Jason Cisneros is a 58 y.o. male who presents to the Emergency Department complaining of laceration to left eyebrow after tripping over his nebulizer tubing and falling. He reports history of stroke; he comprehends speech but uses a computer to communicate. No other injuries  Past Medical History  Diagnosis Date  . Back pain   . Dementia   . Hyperlipidemia   . Gait disturbance   . Depressive disorder   . COPD (chronic obstructive pulmonary disease)   . Dysphagia   . CVA (cerebral infarction)    Past Surgical History  Procedure Laterality Date  . Cholecystectomy     History reviewed. No pertinent family history. History  Substance Use Topics  . Smoking status: Current Every Day Smoker -- 0.10 packs/day    Types: Cigarettes  . Smokeless tobacco: Not on file  . Alcohol Use: No    Review of Systems  Unable to perform ROS: Patient nonverbal  Level 5 caveat due to inability to speak   Allergies  Review of patient's allergies indicates no known allergies.  Home Medications   Prior to Admission medications   Medication Sig Start Date End Date Taking? Authorizing Provider  amLODipine (NORVASC) 5 MG tablet Take 1 tablet by mouth daily. 07/25/13   Historical Provider, MD  aspirin EC 81 MG tablet Take 81 mg by mouth daily. For status post cva.    Historical Provider, MD  atorvastatin (LIPITOR) 20 MG tablet Take 20 mg by mouth every evening.    Historical Provider, MD   baclofen (LIORESAL) 20 MG tablet Take 20 mg by mouth 2 (two) times daily. For gait disturbances,muscle spasm    Historical Provider, MD  cholecalciferol (VITAMIN D) 1000 UNITS tablet Take 1,000 Units by mouth daily.    Historical Provider, MD  ipratropium-albuterol (DUONEB) 0.5-2.5 (3) MG/3ML SOLN Take 3 mLs by nebulization 3 (three) times daily. For COPD.    Historical Provider, MD  ipratropium-albuterol (DUONEB) 0.5-2.5 (3) MG/3ML SOLN Take 3 mLs by nebulization every 6 (six) hours as needed (for shortness of breath or wheezing.).    Historical Provider, MD  lisinopril (PRINIVIL,ZESTRIL) 20 MG tablet Take 1 tablet by mouth daily. 04/16/13   Historical Provider, MD  LORazepam (ATIVAN) 0.5 MG tablet Take 0.5 mg by mouth every 8 (eight) hours as needed for anxiety.    Historical Provider, MD  meloxicam (MOBIC) 15 MG tablet Take 15 mg by mouth 2 (two) times daily. For gout.    Historical Provider, MD  mirtazapine (REMERON) 30 MG tablet Take 30 mg by mouth at bedtime.    Historical Provider, MD  OXcarbazepine (TRILEPTAL) 150 MG tablet Take 150 mg by mouth 2 (two) times daily.    Historical Provider, MD  tiotropium (SPIRIVA) 18 MCG inhalation capsule Place 18 mcg into inhaler and inhale daily.    Historical Provider, MD   BP 174/68  Pulse 64  Temp(Src) 98.4 F (36.9 C) (Oral)  Resp 20  Ht  (1.6 m)  Wt 140 lb (63.504 kg)  BMI 24.81 kg/m2  SpO2 94% Physical Exam  Nursing note and vitals reviewed. Constitutional: He is oriented to person, place, and time. He appears well-developed and well-nourished.  HENT:  Head: Normocephalic and atraumatic.  1.5 cm oblique laceration over left lateral eyebrow  Eyes: Conjunctivae and EOM are normal. Pupils are equal, round, and reactive to light.  Neck: Normal range of motion. Neck supple.  Cardiovascular: Normal rate, regular rhythm and normal heart sounds.   Pulmonary/Chest: Effort normal and breath sounds normal.  Abdominal: Soft. Bowel sounds are  normal.  Musculoskeletal: Normal range of motion.  Neurological: He is alert and oriented to person, place, and time.  Unable to talk and communicates via a computer  Skin: Skin is warm and dry.  Psychiatric: He has a normal mood and affect. His behavior is normal.    ED Course  Procedures (including critical care time) Labs Review Labs Reviewed - No data to display  Imaging Review No results found.   EKG Interpretation None     DIAGNOSTIC STUDIES: Oxygen Saturation is 94% on room air, adequate by my interpretation.    COORDINATION OF CARE: Will apply Steri-Strips to the laceration on his forehead.    MDM   Final diagnoses:  Laceration of left eyebrow, initial encounter   patient is in no acute distress. He has a small oblique laceration to his left lateral eyebrow. Wound was cleaned and Steri-Strips applied. No change in behavior. No imaging warranted  I personally performed the services described in this documentation, which was scribed in my presence. The recorded information has been reviewed and is accurate.   Donnetta Hutching, MD 12/27/13 [phone]

## 2013-12-26 NOTE — ED Notes (Signed)
Patient tripped over nebulizer and fell. Hit his head, has a laceration to head. Patient has head bandaged.

## 2013-12-27 NOTE — Discharge Instructions (Signed)
Keep Steri-Strips on for several days.

## 2014-01-02 ENCOUNTER — Encounter (HOSPITAL_COMMUNITY): Payer: Self-pay | Admitting: Emergency Medicine

## 2014-01-02 ENCOUNTER — Emergency Department (HOSPITAL_COMMUNITY): Payer: Medicare Other

## 2014-01-02 ENCOUNTER — Inpatient Hospital Stay (HOSPITAL_COMMUNITY)
Admission: EM | Admit: 2014-01-02 | Discharge: 2014-01-06 | DRG: 190 | Disposition: A | Discharge status: Skilled Nursing Facility | Payer: Medicare Other | Attending: Internal Medicine | Admitting: Internal Medicine

## 2014-01-02 DIAGNOSIS — T17928D Food in respiratory tract, part unspecified causing other injury, subsequent encounter: Secondary | ICD-10-CM

## 2014-01-02 DIAGNOSIS — T17920A Food in respiratory tract, part unspecified causing asphyxiation, initial encounter: Secondary | ICD-10-CM | POA: Diagnosis present

## 2014-01-02 DIAGNOSIS — Z993 Dependence on wheelchair: Secondary | ICD-10-CM

## 2014-01-02 DIAGNOSIS — I69959 Hemiplegia and hemiparesis following unspecified cerebrovascular disease affecting unspecified side: Secondary | ICD-10-CM

## 2014-01-02 DIAGNOSIS — E872 Acidosis, unspecified: Secondary | ICD-10-CM | POA: Diagnosis present

## 2014-01-02 DIAGNOSIS — J9602 Acute respiratory failure with hypercapnia: Secondary | ICD-10-CM

## 2014-01-02 DIAGNOSIS — T17920D Food in respiratory tract, part unspecified causing asphyxiation, subsequent encounter: Secondary | ICD-10-CM

## 2014-01-02 DIAGNOSIS — J9600 Acute respiratory failure, unspecified whether with hypoxia or hypercapnia: Secondary | ICD-10-CM | POA: Diagnosis present

## 2014-01-02 DIAGNOSIS — J9601 Acute respiratory failure with hypoxia: Secondary | ICD-10-CM | POA: Diagnosis present

## 2014-01-02 DIAGNOSIS — G1221 Amyotrophic lateral sclerosis: Secondary | ICD-10-CM | POA: Diagnosis present

## 2014-01-02 DIAGNOSIS — T17928A Food in respiratory tract, part unspecified causing other injury, initial encounter: Secondary | ICD-10-CM | POA: Diagnosis present

## 2014-01-02 DIAGNOSIS — R739 Hyperglycemia, unspecified: Secondary | ICD-10-CM | POA: Diagnosis present

## 2014-01-02 DIAGNOSIS — E785 Hyperlipidemia, unspecified: Secondary | ICD-10-CM | POA: Diagnosis present

## 2014-01-02 DIAGNOSIS — F17200 Nicotine dependence, unspecified, uncomplicated: Secondary | ICD-10-CM | POA: Diagnosis present

## 2014-01-02 DIAGNOSIS — D696 Thrombocytopenia, unspecified: Secondary | ICD-10-CM | POA: Diagnosis present

## 2014-01-02 DIAGNOSIS — J441 Chronic obstructive pulmonary disease with (acute) exacerbation: Principal | ICD-10-CM | POA: Diagnosis present

## 2014-01-02 DIAGNOSIS — Z8673 Personal history of transient ischemic attack (TIA), and cerebral infarction without residual deficits: Secondary | ICD-10-CM

## 2014-01-02 DIAGNOSIS — F482 Pseudobulbar affect: Secondary | ICD-10-CM | POA: Diagnosis present

## 2014-01-02 DIAGNOSIS — R1310 Dysphagia, unspecified: Secondary | ICD-10-CM | POA: Diagnosis present

## 2014-01-02 DIAGNOSIS — F0390 Unspecified dementia without behavioral disturbance: Secondary | ICD-10-CM | POA: Diagnosis present

## 2014-01-02 DIAGNOSIS — I69991 Dysphagia following unspecified cerebrovascular disease: Secondary | ICD-10-CM

## 2014-01-02 DIAGNOSIS — Z66 Do not resuscitate: Secondary | ICD-10-CM | POA: Diagnosis present

## 2014-01-02 DIAGNOSIS — R0603 Acute respiratory distress: Secondary | ICD-10-CM

## 2014-01-02 DIAGNOSIS — I10 Essential (primary) hypertension: Secondary | ICD-10-CM | POA: Diagnosis present

## 2014-01-02 DIAGNOSIS — E870 Hyperosmolality and hypernatremia: Secondary | ICD-10-CM | POA: Diagnosis present

## 2014-01-02 DIAGNOSIS — R0609 Other forms of dyspnea: Secondary | ICD-10-CM | POA: Diagnosis not present

## 2014-01-02 HISTORY — DX: Essential (primary) hypertension: I10

## 2014-01-02 HISTORY — DX: Cerebral infarction, unspecified: I63.9

## 2014-01-02 LAB — CBC WITH DIFFERENTIAL/PLATELET
Basophils Absolute: 0 10*3/uL (ref 0.0–0.1)
Basophils Relative: 0 % (ref 0–1)
Eosinophils Absolute: 0 10*3/uL (ref 0.0–0.7)
Eosinophils Relative: 0 % (ref 0–5)
HEMATOCRIT: 49.8 % (ref 39.0–52.0)
HEMOGLOBIN: 16.3 g/dL (ref 13.0–17.0)
LYMPHS PCT: 10 % — AB (ref 12–46)
Lymphs Abs: 0.9 10*3/uL (ref 0.7–4.0)
MCH: 30.4 pg (ref 26.0–34.0)
MCHC: 32.7 g/dL (ref 30.0–36.0)
MCV: 92.7 fL (ref 78.0–100.0)
MONOS PCT: 1 % — AB (ref 3–12)
Monocytes Absolute: 0.1 10*3/uL (ref 0.1–1.0)
NEUTROS ABS: 7.8 10*3/uL — AB (ref 1.7–7.7)
NEUTROS PCT: 89 % — AB (ref 43–77)
Platelets: 170 10*3/uL (ref 150–400)
RBC: 5.37 MIL/uL (ref 4.22–5.81)
RDW: 13.2 % (ref 11.5–15.5)
WBC: 8.8 10*3/uL (ref 4.0–10.5)

## 2014-01-02 MED ORDER — ETOMIDATE 2 MG/ML IV SOLN
INTRAVENOUS | Status: AC
  Filled 2014-01-02: qty 20

## 2014-01-02 MED ORDER — LIDOCAINE HCL (CARDIAC) 20 MG/ML IV SOLN
INTRAVENOUS | Status: AC
  Filled 2014-01-02: qty 5

## 2014-01-02 MED ORDER — METHYLPREDNISOLONE SODIUM SUCC 125 MG IJ SOLR
80.0000 mg | Freq: Once | INTRAMUSCULAR | Status: DC
  Filled 2014-01-02: qty 2

## 2014-01-02 MED ORDER — ROCURONIUM BROMIDE 50 MG/5ML IV SOLN
INTRAVENOUS | Status: AC
  Filled 2014-01-02: qty 2

## 2014-01-02 MED ORDER — SUCCINYLCHOLINE CHLORIDE 20 MG/ML IJ SOLN
INTRAMUSCULAR | Status: AC
  Filled 2014-01-02: qty 1

## 2014-01-02 NOTE — ED Notes (Signed)
resp distress, pt from [facility] who reportedly started sob for the past several hours, given 125 solumedrol and neb treatments without relief

## 2014-01-02 NOTE — ED Provider Notes (Addendum)
CSN: [id number]     Arrival date & time 01/02/14  2334 History  This chart was scribed for Jason Baton, MD by Freida Busman, ED Scribe. This patient was seen in room APA02/APA02 and the patient's care was started 11:35 PM.    Chief Complaint  Patient presents with  . Respiratory Distress  Level 5 caveat: Unable to fully obtain HPI/ROS due to acuity of medical condition.  No language interpreter was used.    HPI Comments:  Jason Cisneros is a 58 y.o. male with a h/o COPD  who presents to the Emergency Department brought in by ambulance for severe trouble breathing. Pt was placed on CPAP en route with no relief.  Per EMS report, patient was given Solu-Medrol and Xopenex at his living facility earlier today without improvement.  Upon arrival, patient was satting in the low 70s and was having difficulty breathing. He is placed on CPAP with improvement of his oxygenation into the low 80s. Patient is nonverbal at baseline and communicates with a computer.  No reported fevers.   Past Medical History  Diagnosis Date  . Back pain   . Dementia   . Hyperlipidemia   . Gait disturbance   . Depressive disorder   . COPD (chronic obstructive pulmonary disease)   . Dysphagia   . CVA (cerebral infarction)    Past Surgical History  Procedure Laterality Date  . Cholecystectomy     No family history on file. History  Substance Use Topics  . Smoking status: Current Every Day Smoker -- 0.10 packs/day    Types: Cigarettes  . Smokeless tobacco: Not on file  . Alcohol Use: No    Review of Systems  Unable to perform ROS: Acuity of condition      Allergies  Review of patient's allergies indicates no known allergies.  Home Medications   Prior to Admission medications   Medication Sig Start Date End Date Taking? Authorizing Provider  amLODipine (NORVASC) 5 MG tablet Take 1 tablet by mouth daily. 07/25/13   Historical Provider, MD  aspirin EC 81 MG tablet Take 81 mg by mouth daily. For  status post cva.    Historical Provider, MD  atorvastatin (LIPITOR) 20 MG tablet Take 20 mg by mouth every evening.    Historical Provider, MD  baclofen (LIORESAL) 20 MG tablet Take 20 mg by mouth 2 (two) times daily. For gait disturbances,muscle spasm    Historical Provider, MD  cholecalciferol (VITAMIN D) 1000 UNITS tablet Take 1,000 Units by mouth daily.    Historical Provider, MD  ipratropium-albuterol (DUONEB) 0.5-2.5 (3) MG/3ML SOLN Take 3 mLs by nebulization 3 (three) times daily. For COPD.    Historical Provider, MD  ipratropium-albuterol (DUONEB) 0.5-2.5 (3) MG/3ML SOLN Take 3 mLs by nebulization every 6 (six) hours as needed (for shortness of breath or wheezing.).    Historical Provider, MD  lisinopril (PRINIVIL,ZESTRIL) 20 MG tablet Take 1 tablet by mouth daily. 04/16/13   Historical Provider, MD  LORazepam (ATIVAN) 0.5 MG tablet Take 0.5 mg by mouth every 8 (eight) hours as needed for anxiety.    Historical Provider, MD  meloxicam (MOBIC) 15 MG tablet Take 15 mg by mouth 2 (two) times daily. For gout.    Historical Provider, MD  mirtazapine (REMERON) 30 MG tablet Take 30 mg by mouth at bedtime.    Historical Provider, MD  OXcarbazepine (TRILEPTAL) 150 MG tablet Take 150 mg by mouth 2 (two) times daily.    Historical Provider, MD  tiotropium (  SPIRIVA) 18 MCG inhalation capsule Place 18 mcg into inhaler and inhale daily.    Historical Provider, MD   BP 193/116  Pulse 120  Temp(Src) 98.4 F (36.9 C) (Rectal)  Resp 30  Ht  (1.6 m)  Wt 140 lb (63.504 kg)  BMI 24.81 kg/m2  SpO2 97% Physical Exam  Nursing note and vitals reviewed. Constitutional:  Appears older than stated age, chronically ill-appearing, acute respiratory distress with CPAP in place  HENT:  Head: Normocephalic and atraumatic.  Eyes: Pupils are equal, round, and reactive to light.  Neck: Neck supple. No JVD present.  Cardiovascular: Regular rhythm and normal heart sounds.   No murmur heard. Tachycardia   Pulmonary/Chest: He is in respiratory distress. He has no wheezes. He has rales.  Increased work of breathing, tachypnea, poor air movement with coarse Rales bilaterally  Abdominal: Soft. Bowel sounds are normal. There is no tenderness. There is no rebound.  Musculoskeletal: He exhibits no edema.  Neurological: He is alert.  Shake his head appropriately to yes and no questions  Skin: Skin is warm and dry.  Psychiatric:  Anxious appearing    ED Course  Procedures  CRITICAL CARE Performed by: Ross Marcus, F   Total critical care time: 60 min  Critical care time was exclusive of separately billable procedures and treating other patients.  Critical care was necessary to treat or prevent imminent or life-threatening deterioration.  Critical care was time spent personally by me on the following activities: development of treatment plan with patient and/or surrogate as well as nursing, discussions with consultants, evaluation of patient's response to treatment, examination of patient, obtaining history from patient or surrogate, ordering and performing treatments and interventions, ordering and review of laboratory studies, ordering and review of radiographic studies, pulse oximetry and re-evaluation of patient's condition.  Labs Review Labs Reviewed  CBC WITH DIFFERENTIAL - Abnormal; Notable for the following:    Neutrophils Relative % 89 (*)    Neutro Abs 7.8 (*)    Lymphocytes Relative 10 (*)    Monocytes Relative 1 (*)    All other components within normal limits  BASIC METABOLIC PANEL - Abnormal; Notable for the following:    CO2 34 (*)    Glucose, Bld 232 (*)    BUN 25 (*)    All other components within normal limits  PRO B NATRIURETIC PEPTIDE - Abnormal; Notable for the following:    Pro B Natriuretic peptide (BNP) 231.3 (*)    All other components within normal limits  CULTURE, BLOOD (ROUTINE X 2)  CULTURE, BLOOD (ROUTINE X 2)  TROPONIN I  LACTIC ACID, PLASMA   BLOOD GAS, ARTERIAL    Imaging Review Dg Chest Portable 1 View  01/03/2014   CLINICAL DATA:  COPD, respiratory distress  EXAM: PORTABLE CHEST - 1 VIEW  COMPARISON:  08/03/2013  FINDINGS: The heart size and mediastinal contours are within normal limits. Both lungs are clear. The visualized skeletal structures are unremarkable.  IMPRESSION: No active disease.   Electronically Signed   By: Esperanza Heir M.D.   On: 01/03/2014 00:33     EKG Interpretation Sinus tachycardia with a rate of 113, no evidence of acute ST elevation or ischemia      MDM   Final diagnoses:  Respiratory distress  Chronic obstructive pulmonary disease with acute exacerbation    Patient presents in acute respiratory distress. He is tachypneic with increased work of breathing on initial assessment. Initial oxygen saturations on 10 L 73%. Patient  placed on BiPAP. No evidence of volume overload. Patient is very tight with poor air movement and Rales bilaterally. Patient was given continues to douoneb and magnesium.   Initial lab work largely unremarkable without evidence of leukocytosis. Lactate normal. Patient is afebrile. Patient was given Levaquin for presumed COPD exacerbation and blood cultures were drawn for this reason. ABG approximately one hour on BiPAP shows an acute respiratory acidosis with a CO2 of 69.   On reexam, patient appears more comfortable on BiPAP but continues to be very tight with poor air movement. Second continuous neb was ordered. Discussed with Dr. Alvester Morin for admission to the ICU for acute respiratory failure secondary to acute COPD exacerbation.  Dr. Alvester Morin at the bedside.  Will add UDS, urinalysis, and d-dimer. Patient not clinically stable for CT scan; however, if d-dimer is elevated we'll add heparin.   I personally performed the services described in this documentation, which was scribed in my presence. The recorded information has been reviewed and is accurate.     Jason Baton,  MD 01/03/14 1610  Jason Baton, MD 01/03/14 [phone]

## 2014-01-03 ENCOUNTER — Encounter (HOSPITAL_COMMUNITY): Payer: Self-pay | Admitting: *Deleted

## 2014-01-03 DIAGNOSIS — E870 Hyperosmolality and hypernatremia: Secondary | ICD-10-CM | POA: Diagnosis present

## 2014-01-03 DIAGNOSIS — Z66 Do not resuscitate: Secondary | ICD-10-CM | POA: Diagnosis present

## 2014-01-03 DIAGNOSIS — E872 Acidosis, unspecified: Secondary | ICD-10-CM | POA: Diagnosis present

## 2014-01-03 DIAGNOSIS — J9600 Acute respiratory failure, unspecified whether with hypoxia or hypercapnia: Secondary | ICD-10-CM | POA: Diagnosis present

## 2014-01-03 DIAGNOSIS — J441 Chronic obstructive pulmonary disease with (acute) exacerbation: Principal | ICD-10-CM

## 2014-01-03 DIAGNOSIS — R7309 Other abnormal glucose: Secondary | ICD-10-CM

## 2014-01-03 DIAGNOSIS — R0989 Other specified symptoms and signs involving the circulatory and respiratory systems: Secondary | ICD-10-CM | POA: Diagnosis present

## 2014-01-03 DIAGNOSIS — F17200 Nicotine dependence, unspecified, uncomplicated: Secondary | ICD-10-CM | POA: Diagnosis present

## 2014-01-03 DIAGNOSIS — D696 Thrombocytopenia, unspecified: Secondary | ICD-10-CM | POA: Diagnosis present

## 2014-01-03 DIAGNOSIS — J9601 Acute respiratory failure with hypoxia: Secondary | ICD-10-CM | POA: Diagnosis present

## 2014-01-03 DIAGNOSIS — R739 Hyperglycemia, unspecified: Secondary | ICD-10-CM | POA: Diagnosis present

## 2014-01-03 DIAGNOSIS — I69959 Hemiplegia and hemiparesis following unspecified cerebrovascular disease affecting unspecified side: Secondary | ICD-10-CM | POA: Diagnosis not present

## 2014-01-03 DIAGNOSIS — R0609 Other forms of dyspnea: Secondary | ICD-10-CM

## 2014-01-03 DIAGNOSIS — J9602 Acute respiratory failure with hypercapnia: Secondary | ICD-10-CM

## 2014-01-03 DIAGNOSIS — F0390 Unspecified dementia without behavioral disturbance: Secondary | ICD-10-CM | POA: Diagnosis present

## 2014-01-03 DIAGNOSIS — I10 Essential (primary) hypertension: Secondary | ICD-10-CM | POA: Diagnosis present

## 2014-01-03 DIAGNOSIS — Z8673 Personal history of transient ischemic attack (TIA), and cerebral infarction without residual deficits: Secondary | ICD-10-CM

## 2014-01-03 DIAGNOSIS — Z993 Dependence on wheelchair: Secondary | ICD-10-CM | POA: Diagnosis not present

## 2014-01-03 DIAGNOSIS — G1221 Amyotrophic lateral sclerosis: Secondary | ICD-10-CM | POA: Diagnosis present

## 2014-01-03 DIAGNOSIS — E785 Hyperlipidemia, unspecified: Secondary | ICD-10-CM | POA: Diagnosis present

## 2014-01-03 DIAGNOSIS — F482 Pseudobulbar affect: Secondary | ICD-10-CM | POA: Diagnosis present

## 2014-01-03 LAB — BLOOD GAS, ARTERIAL
ACID-BASE EXCESS: 4.1 mmol/L — AB (ref 0.0–2.0)
Acid-Base Excess: 3.6 mmol/L — ABNORMAL HIGH (ref 0.0–2.0)
Bicarbonate: 30 mEq/L — ABNORMAL HIGH (ref 20.0–24.0)
Bicarbonate: 29.2 mEq/L — ABNORMAL HIGH (ref 20.0–24.0)
Delivery systems: POSITIVE
Drawn by: 23534
Drawn by: 234301
Expiratory PAP: 5
FIO2: 0.6 %
Inspiratory PAP: 18
O2 Content: 60 L/min
O2 Content: 2 L/min
O2 SAT: 93.8 %
O2 Saturation: 95.8 %
PCO2 ART: 53 mmHg — AB (ref 35.0–45.0)
PO2 ART: 69.9 mmHg — AB (ref 80.0–100.0)
Patient temperature: 37
Patient temperature: 37
TCO2: 26.9 mmol/L (ref 0–100)
TCO2: 26.2 mmol/L (ref 0–100)
pCO2 arterial: 68.7 mmHg (ref 35.0–45.0)
pH, Arterial: 7.263 — ABNORMAL LOW (ref 7.350–7.450)
pH, Arterial: 7.36 (ref 7.350–7.450)
pO2, Arterial: 90.2 mmHg (ref 80.0–100.0)

## 2014-01-03 LAB — COMPREHENSIVE METABOLIC PANEL
ALT: 29 U/L (ref 0–53)
ANION GAP: 12 (ref 5–15)
AST: 22 U/L (ref 0–37)
Albumin: 3.7 g/dL (ref 3.5–5.2)
Alkaline Phosphatase: 68 U/L (ref 39–117)
BUN: 28 mg/dL — AB (ref 6–23)
CO2: 32 mEq/L (ref 19–32)
Calcium: 8.8 mg/dL (ref 8.4–10.5)
Chloride: 104 mEq/L (ref 96–112)
Creatinine, Ser: 0.83 mg/dL (ref 0.50–1.35)
GFR calc non Af Amer: >90 mL/min (ref >90)
GLUCOSE: 186 mg/dL — AB (ref 70–99)
POTASSIUM: 4.4 meq/L (ref 3.7–5.3)
SODIUM: 148 meq/L — AB (ref 137–147)
Total Protein: 6.3 g/dL (ref 6.0–8.3)

## 2014-01-03 LAB — CBC WITH DIFFERENTIAL/PLATELET
BASOS ABS: 0 10*3/uL (ref 0.0–0.1)
BASOS PCT: 0 % (ref 0–1)
EOS ABS: 0 10*3/uL (ref 0.0–0.7)
EOS PCT: 0 % (ref 0–5)
HEMATOCRIT: 43.5 % (ref 39.0–52.0)
Hemoglobin: 14.1 g/dL (ref 13.0–17.0)
Lymphocytes Relative: 5 % — ABNORMAL LOW (ref 12–46)
Lymphs Abs: 0.7 10*3/uL (ref 0.7–4.0)
MCH: 29.9 pg (ref 26.0–34.0)
MCHC: 32.4 g/dL (ref 30.0–36.0)
MCV: 92.4 fL (ref 78.0–100.0)
MONO ABS: 0.3 10*3/uL (ref 0.1–1.0)
MONOS PCT: 2 % — AB (ref 3–12)
NEUTROS ABS: 11.7 10*3/uL — AB (ref 1.7–7.7)
Neutrophils Relative %: 93 % — ABNORMAL HIGH (ref 43–77)
Platelets: 141 10*3/uL — ABNORMAL LOW (ref 150–400)
RBC: 4.71 MIL/uL (ref 4.22–5.81)
RDW: 13.3 % (ref 11.5–15.5)
WBC: 12.6 10*3/uL — ABNORMAL HIGH (ref 4.0–10.5)

## 2014-01-03 LAB — BASIC METABOLIC PANEL
ANION GAP: 11 (ref 5–15)
BUN: 25 mg/dL — ABNORMAL HIGH (ref 6–23)
CHLORIDE: 101 meq/L (ref 96–112)
CO2: 34 meq/L — AB (ref 19–32)
CREATININE: 0.78 mg/dL (ref 0.50–1.35)
Calcium: 9.6 mg/dL (ref 8.4–10.5)
GFR calc Af Amer: >90 mL/min (ref >90)
GFR calc non Af Amer: >90 mL/min (ref >90)
Glucose, Bld: 232 mg/dL — ABNORMAL HIGH (ref 70–99)
POTASSIUM: 4.4 meq/L (ref 3.7–5.3)
Sodium: 146 mEq/L (ref 137–147)

## 2014-01-03 LAB — URINALYSIS, ROUTINE W REFLEX MICROSCOPIC
BILIRUBIN URINE: NEGATIVE
Glucose, UA: NEGATIVE mg/dL
Ketones, ur: NEGATIVE mg/dL
Leukocytes, UA: NEGATIVE
Nitrite: NEGATIVE
PH: 6 (ref 5.0–8.0)
Protein, ur: 100 mg/dL — AB
Urobilinogen, UA: 0.2 mg/dL (ref 0.0–1.0)

## 2014-01-03 LAB — URINE MICROSCOPIC-ADD ON

## 2014-01-03 LAB — GLUCOSE, CAPILLARY
GLUCOSE-CAPILLARY: 147 mg/dL — AB (ref 70–99)
Glucose-Capillary: 132 mg/dL — ABNORMAL HIGH (ref 70–99)
Glucose-Capillary: 124 mg/dL — ABNORMAL HIGH (ref 70–99)

## 2014-01-03 LAB — D-DIMER, QUANTITATIVE: D-Dimer, Quant: 0.32 ug/mL-FEU (ref 0.00–0.48)

## 2014-01-03 LAB — RAPID URINE DRUG SCREEN, HOSP PERFORMED
Amphetamines: NOT DETECTED
Barbiturates: NOT DETECTED
Benzodiazepines: NOT DETECTED
Cocaine: NOT DETECTED
OPIATES: NOT DETECTED
Tetrahydrocannabinol: NOT DETECTED

## 2014-01-03 LAB — TROPONIN I

## 2014-01-03 LAB — LACTIC ACID, PLASMA: Lactic Acid, Venous: 1.1 mmol/L (ref 0.5–2.2)

## 2014-01-03 LAB — MRSA PCR SCREENING: MRSA BY PCR: NEGATIVE

## 2014-01-03 LAB — HEMOGLOBIN A1C
HEMOGLOBIN A1C: 5.9 % — AB (ref <5.7)
Mean Plasma Glucose: 123 mg/dL — ABNORMAL HIGH (ref <117)

## 2014-01-03 LAB — TSH: TSH: 1.17 u[IU]/mL (ref 0.350–4.500)

## 2014-01-03 LAB — PRO B NATRIURETIC PEPTIDE: Pro B Natriuretic peptide (BNP): 231.3 pg/mL — ABNORMAL HIGH (ref 0–125)

## 2014-01-03 MED ORDER — ALBUTEROL (5 MG/ML) CONTINUOUS INHALATION SOLN
15.0000 mg/h | INHALATION_SOLUTION | Freq: Once | RESPIRATORY_TRACT | Status: AC
  Administered 2014-01-03: 15 mg/h via RESPIRATORY_TRACT
  Filled 2014-01-03: qty 20

## 2014-01-03 MED ORDER — METHYLPREDNISOLONE SODIUM SUCC 125 MG IJ SOLR
125.0000 mg | Freq: Four times a day (QID) | INTRAMUSCULAR | Status: DC
  Administered 2014-01-03: 125 mg via INTRAVENOUS
  Filled 2014-01-03: qty 2

## 2014-01-03 MED ORDER — METHYLPREDNISOLONE SODIUM SUCC 125 MG IJ SOLR
125.0000 mg | Freq: Once | INTRAMUSCULAR | Status: AC
  Administered 2014-01-03: 125 mg via INTRAVENOUS

## 2014-01-03 MED ORDER — SODIUM CHLORIDE 0.9 % IV SOLN
INTRAVENOUS | Status: DC
  Administered 2014-01-03: 900 mL via INTRAVENOUS

## 2014-01-03 MED ORDER — LEVOFLOXACIN IN D5W 750 MG/150ML IV SOLN
750.0000 mg | Freq: Once | INTRAVENOUS | Status: AC
  Administered 2014-01-03: 750 mg via INTRAVENOUS
  Filled 2014-01-03: qty 150

## 2014-01-03 MED ORDER — LEVOFLOXACIN IN D5W 750 MG/150ML IV SOLN
750.0000 mg | INTRAVENOUS | Status: DC
  Administered 2014-01-03 – 2014-01-05 (×3): 750 mg via INTRAVENOUS
  Filled 2014-01-03 (×3): qty 150

## 2014-01-03 MED ORDER — IPRATROPIUM-ALBUTEROL 0.5-2.5 (3) MG/3ML IN SOLN
3.0000 mL | RESPIRATORY_TRACT | Status: DC
  Administered 2014-01-03 (×2): 3 mL via RESPIRATORY_TRACT
  Filled 2014-01-03 (×2): qty 3

## 2014-01-03 MED ORDER — HYDRALAZINE HCL 20 MG/ML IJ SOLN: 5.0000 mg | INTRAMUSCULAR | Status: DC | PRN

## 2014-01-03 MED ORDER — INSULIN ASPART 100 UNIT/ML ~~LOC~~ SOLN
0.0000 [IU] | Freq: Every day | SUBCUTANEOUS | Status: DC
  Administered 2014-01-05: 4 [IU] via SUBCUTANEOUS

## 2014-01-03 MED ORDER — ALBUTEROL (5 MG/ML) CONTINUOUS INHALATION SOLN
15.0000 mg/h | INHALATION_SOLUTION | Freq: Once | RESPIRATORY_TRACT | Status: AC
Start: 2014-01-03 — End: 2014-01-03
  Administered 2014-01-03: 15 mg/h via RESPIRATORY_TRACT

## 2014-01-03 MED ORDER — LORAZEPAM 2 MG/ML IJ SOLN
0.2500 mg | Freq: Four times a day (QID) | INTRAMUSCULAR | Status: DC | PRN
  Administered 2014-01-04 – 2014-01-06 (×3): 0.25 mg via INTRAVENOUS
  Filled 2014-01-03 (×4): qty 1

## 2014-01-03 MED ORDER — HEPARIN SODIUM (PORCINE) 5000 UNIT/ML IJ SOLN
5000.0000 [IU] | Freq: Three times a day (TID) | INTRAMUSCULAR | Status: DC
  Administered 2014-01-03 – 2014-01-06 (×10): 5000 [IU] via SUBCUTANEOUS
  Filled 2014-01-03 (×10): qty 1

## 2014-01-03 MED ORDER — SODIUM CHLORIDE 0.9 % IJ SOLN
3.0000 mL | Freq: Two times a day (BID) | INTRAMUSCULAR | Status: DC
  Administered 2014-01-03 – 2014-01-06 (×7): 3 mL via INTRAVENOUS

## 2014-01-03 MED ORDER — LEVOFLOXACIN IN D5W 750 MG/150ML IV SOLN: 750.0000 mg | INTRAVENOUS | Status: DC

## 2014-01-03 MED ORDER — INSULIN ASPART 100 UNIT/ML ~~LOC~~ SOLN
0.0000 [IU] | Freq: Three times a day (TID) | SUBCUTANEOUS | Status: DC
Start: 2014-01-03 — End: 2014-01-06
  Administered 2014-01-03 – 2014-01-05 (×3): 3 [IU] via SUBCUTANEOUS
  Administered 2014-01-06: 4 [IU] via SUBCUTANEOUS

## 2014-01-03 MED ORDER — IPRATROPIUM-ALBUTEROL 0.5-2.5 (3) MG/3ML IN SOLN
3.0000 mL | RESPIRATORY_TRACT | Status: DC
  Administered 2014-01-03 – 2014-01-06 (×17): 3 mL via RESPIRATORY_TRACT
  Filled 2014-01-03 (×18): qty 3

## 2014-01-03 MED ORDER — LORAZEPAM 2 MG/ML IJ SOLN
0.5000 mg | Freq: Once | INTRAMUSCULAR | Status: AC
  Administered 2014-01-03: 0.5 mg via INTRAVENOUS
  Filled 2014-01-03: qty 1

## 2014-01-03 MED ORDER — IPRATROPIUM BROMIDE 0.02 % IN SOLN
0.5000 mg | Freq: Once | RESPIRATORY_TRACT | Status: AC
  Administered 2014-01-03: 0.5 mg via RESPIRATORY_TRACT
  Filled 2014-01-03: qty 2.5

## 2014-01-03 MED ORDER — FAMOTIDINE IN NACL 20-0.9 MG/50ML-% IV SOLN
20.0000 mg | INTRAVENOUS | Status: DC
  Administered 2014-01-03 – 2014-01-06 (×4): 20 mg via INTRAVENOUS
  Filled 2014-01-03 (×7): qty 50

## 2014-01-03 MED ORDER — POTASSIUM CHLORIDE IN NACL 20-0.45 MEQ/L-% IV SOLN
INTRAVENOUS | Status: DC
Start: 2014-01-03 — End: 2014-01-06
  Administered 2014-01-03 – 2014-01-06 (×4): via INTRAVENOUS
  Filled 2014-01-03 (×13): qty 1000

## 2014-01-03 MED ORDER — IPRATROPIUM-ALBUTEROL 0.5-2.5 (3) MG/3ML IN SOLN: 3.0000 mL | RESPIRATORY_TRACT | Status: DC | PRN

## 2014-01-03 MED ORDER — MAGNESIUM SULFATE 40 MG/ML IJ SOLN
2.0000 g | Freq: Once | INTRAMUSCULAR | Status: AC
  Administered 2014-01-03: 2 g via INTRAVENOUS
  Filled 2014-01-03: qty 50

## 2014-01-03 MED ORDER — METHYLPREDNISOLONE SODIUM SUCC 125 MG IJ SOLR
125.0000 mg | Freq: Four times a day (QID) | INTRAMUSCULAR | Status: DC
Start: 2014-01-03 — End: 2014-01-03

## 2014-01-03 MED ORDER — METHYLPREDNISOLONE SODIUM SUCC 125 MG IJ SOLR
80.0000 mg | Freq: Four times a day (QID) | INTRAMUSCULAR | Status: DC
  Administered 2014-01-03 – 2014-01-05 (×10): 80 mg via INTRAVENOUS
  Filled 2014-01-03 (×10): qty 2

## 2014-01-03 NOTE — Clinical Social Work Psychosocial (Signed)
    Clinical Social Work Department BRIEF PSYCHOSOCIAL ASSESSMENT 01/03/2014  Patient:  Jason Cisneros, Jason Cisneros     Account Number:  [id number]     Admit date:  01/02/2014  Clinical Social Worker:  Santa Genera, CLINICAL SOCIAL WORKER  Date/Time:  01/03/2014 10:30 AM  Referred by:  CSW  Date Referred:  01/03/2014 Referred for  SNF Placement   Other Referral:   Interview type:  Other - See comment Other interview type:   Spoke w guardian, Jason Cisneros, and DSS APS re guardianship    PSYCHOSOCIAL DATA Living Status:  FACILITY Admitted from facility:  [facility] Level of care:  Skilled Nursing Facility Primary support name:  Jason Cisneros Primary support relationship to patient:  NONE Degree of support available:   Jason Cisneros is patient's guardian of person; ex-girlfirend supportive.  Family not involved.    CURRENT CONCERNS Current Concerns  Post-Acute Placement   Other Concerns:    SOCIAL WORK ASSESSMENT / PLAN CSW unable to assess patient directly, patient oriented to person only.  Spoke w [facility] admissions, Debbie.  Patient has been at [facility] since April 2015 when he was placed at facility post stroke. Ambulates with wheelchair, uses laptop to communicate due to speech impairment.  Says patient is able to sit in various offices throughout facility, visits w staff and patients, appears content w stay at SNF.    Per [facility], patient has guardian Jason Cisneros [phone]); however, they do not have guardianship paperwork.  Asked facility to provide paperwork if its on chart, also left VM for Angie, Clerk of Court, to verify guardianship arrangement.  Spoke w Jason Cisneros.  DSS was temporary guardian for patient in fall 2015.  Patient was living alone in apartment on [address] in [place].  Jason Cisneros is long time friend of patient, Oncologist who encouraged patient to reenroll is school and was his rep payee until 05/15/2013. Jason Cisneros assisted with  taking patient shopping and on errands.   Patient was driving and maintaining independence 03/2014;patient had several "mini strokes" and  [facility] "took over" his care in 05/2013,became his rep payee,  put aide in home several hours/day.  Guardian concerned that patient was not getting sufficient physical activitiy.  Concerned that "things were missing in the home."  Went to court and acquired guardianship of person for patient.  Jason Cisneros and ex-girlfriend Jason Cisneros are both supportive and involved w patient's care.    Per guardian, patient has sister in [place] and brothers in [place] but they are unsupportive and uninvolved. Guardian wants patient to have an Ipad which would increase communication, has not been able to find anyone willing to fund this.    Guardian and facility are both willing for patient to return at discharge.  [facility] asked to fax guardianship paperwork to [facility] [address] station if available.   Assessment/plan status:  Psychosocial Support/Ongoing Assessment of Needs Other assessment/ plan:   Information/referral to community resources:   Return to [facility]    PATIENT'S/FAMILY'S RESPONSE TO PLAN OF CARE: Weston Settle frustrated with process of getting patient needed resources to assist w speech.        Santa Genera, LCSW Clinical Social Worker [phone])

## 2014-01-03 NOTE — Progress Notes (Signed)
INITIAL NUTRITION ASSESSMENT  DOCUMENTATION CODES Per approved criteria  -Not Applicable   INTERVENTION: RD to follow for diet advancement  NUTRITION DIAGNOSIS: Inadequate oral intake related to inability to eat as evidenced by NPO.   Goal: Pt will meet >90% of estimated nutritional needs  Monitor:  Diet advancement, PO intake, labs, weight changes, I/O's  Reason for Assessment: MST=2  58 y.o. male  Admitting Dx: <principal problem not specified>  This is a 58 y.o. year old male with significant past medical history of COPD, hypertension, CVA presenting with acute respiratory failure with hypoxia and hypercarbia. Level V caveat as patient is acutely in respiratory distress and is nonverbal at baseline. Per report, patient with worsening respiratory status over the past 24 hours at his skilled nursing facility. Was given Solu-Medrol Xopenex at the living facility per her report with minimal improvement in symptoms. Still smoking daily. Patient was noted to be satting in the mid 70s on 10 L.  ASSESSMENT: Pt is a resident of [facility] who was admitted for respiratory distress. He is currently on CPAP, with improved oxygenation.   Pt unable to participate in interview due to cognitive deficit. Unable to perform nutrition-focused physical exam at time, as pt being assisted performing self-care activities at time of visit.  He is currently NPO.  Documented wt hx reveals progressive wt loss over the past 8 months, including a 6% weight loss over the past month.  Labs reviewed. NA: 138. BUN: 28, Glucose: 186. Glucose elevated likely due to steroids.   Height: Ht Readings from Last 1 Encounters:  01/03/14  (1.651 m)    Weight: Wt Readings from Last 1 Encounters:  01/03/14 125 lb 10.6 oz (57 kg)    Ideal Body Weight: 136#  % Ideal Body Weight: 92%  Wt Readings from Last 10 Encounters:  01/03/14 125 lb 10.6 oz (57 kg)  12/26/13 140 lb (63.504 kg)  12/10/13 133 lb 4.8 oz  (60.464 kg)  05/03/13 141 lb (63.957 kg)    Usual Body Weight: 140#  % Usual Body Weight: 89%  BMI:  Body mass index is 20.91 kg/(m^2). Normal weight range   Estimated Nutritional Needs: Kcal: 1500-1700 Protein: 68-78 grams Fluid: 1.5-1.7 L  Skin: Intact  Diet Order: NPO  EDUCATION NEEDS: -Education not appropriate at this time   Intake/Output Summary (Last 24 hours) at 01/03/14 0835 Last data filed at 01/03/14 0800  Gross per 24 hour  Intake 449.17 ml  Output    700 ml  Net -250.83 ml    Last BM: PTA  Labs:   Recent Labs Lab 01/02/14 2337 01/03/14 0427  NA 146 148*  K 4.4 4.4  CL 101 104  CO2 34* 32  BUN 25* 28*  CREATININE 0.78 0.83  CALCIUM 9.6 8.8  GLUCOSE 232* 186*    CBG (last 3)  No results found for this basename: GLUCAP,  in the last 72 hours  Scheduled Meds: . heparin  5,000 Units Subcutaneous 3 times per day  . ipratropium-albuterol  3 mL Nebulization Q2H  . levofloxacin (LEVAQUIN) IV  750 mg Intravenous Q24H  . methylPREDNISolone (SOLU-MEDROL) injection  125 mg Intravenous Q6H  . sodium chloride  3 mL Intravenous Q12H    Continuous Infusions: . sodium chloride 50 mL/hr at 01/03/14 0800    Past Medical History  Diagnosis Date  . Back pain   . Dementia   . Hyperlipidemia   . Gait disturbance   . Depressive disorder   . COPD (chronic  obstructive pulmonary disease)   . Dysphagia   . CVA (cerebral infarction)   . Stroke   . Hypertension     Past Surgical History  Procedure Laterality Date  . Cholecystectomy      Zeyna Mkrtchyan A. Mayford Knife, RD, LDN Pager: [phone]

## 2014-01-03 NOTE — H&P (Signed)
Hospitalist Admission History and Physical  Patient name: Jason Cisneros Medical record number: [id number] Date of birth: 04-07-1956 Age: 58 y.o. Gender: male  Primary Care Provider: Pearson Grippe, MD  Chief Complaint: acute resp failure with hypoxia and hypercarbia  History of Present Illness:This is a 58 y.o. year old male with significant past medical history of COPD, hypertension, CVA presenting with acute respiratory failure with hypoxia and hypercarbia. Level V caveat as patient is acutely in respiratory distress and is nonverbal at baseline. Per report, patient with worsening respiratory status over the past 24 hours at his skilled nursing facility. Was given Solu-Medrol Xopenex at the living facility per her report with minimal improvement in symptoms.. Symptoms include cough, increased work of breathing, wheezing. Still smoking daily. No reported fevers. EMS was called to the facility. Patient was noted to be satting in the mid 70s on 10 L. Patient presented to the ER in acute respiratory distress. MAXIMUM TEMPERATURE 98.4, heart rate in the 110s to 120s, respirations and 20s 30s, blood pressure in the 190s over 110s. Satting in the mid 90s on BiPAP. CBC and CMP grossly within normal limits. Chest x-ray negative for pneumonia. Troponin negative x1. Pro BNP mildly elevated at 231. Lactate within normal limits at 1.1.  Assessment and Plan: Jason Cisneros is a 58 y.o. year old male presenting with acute respiratory failure with hypoxia hypercarbia Active Problems:   Acute respiratory failure with hypoxia and hypercarbia   1-acute respiratory failure with hypoxia and hypercarbia -Likely secondary to COPD exacerbation -Continue IV Solu-Medrol, IV Levaquin, DuoNeb's -Continue BiPAP-respiratory status seems to be improving relatively speaking. Low thrush of her intubation. Discussed this with the ER physician. -ICU bed -check UDS and d dimer -NPO   2-hypertensive urgency -#1 likely  contributing -When necessary hydralazine   FEN/GI: NPO  Prophylaxis: sub q heparin  Disposition: pending further evaluation  Code Status:Full Code    Patient Active Problem List   Diagnosis Date Noted  . Acute respiratory failure with hypoxia and hypercarbia 01/03/2014   Past Medical History: Past Medical History  Diagnosis Date  . Back pain   . Dementia   . Hyperlipidemia   . Gait disturbance   . Depressive disorder   . COPD (chronic obstructive pulmonary disease)   . Dysphagia   . CVA (cerebral infarction)     Past Surgical History: Past Surgical History  Procedure Laterality Date  . Cholecystectomy      Social History: History   Social History  . Marital Status: Single    Spouse Name: N/A    Number of Children: N/A  . Years of Education: N/A   Social History Main Topics  . Smoking status: Current Every Day Smoker -- 0.10 packs/day    Types: Cigarettes  . Smokeless tobacco: None  . Alcohol Use: No  . Drug Use: None  . Sexual Activity: None   Other Topics Concern  . None   Social History Narrative  . None    Family History: No family history on file.  Allergies: No Known Allergies  Current Facility-Administered Medications  Medication Dose Route Frequency Provider Last Rate Last Dose  . 0.9 %  sodium chloride infusion   Intravenous Continuous Doree Albee, MD      . heparin injection 5,000 Units  5,000 Units Subcutaneous 3 times per day Doree Albee, MD      . hydrALAZINE (APRESOLINE) injection 5 mg  5 mg Intravenous Q4H PRN Doree Albee, MD      .  ipratropium-albuterol (DUONEB) 0.5-2.5 (3) MG/3ML nebulizer solution 3 mL  3 mL Nebulization Q2H Doree Albee, MD      . ipratropium-albuterol (DUONEB) 0.5-2.5 (3) MG/3ML nebulizer solution 3 mL  3 mL Nebulization Q1H PRN Doree Albee, MD      . levofloxacin (LEVAQUIN) IVPB 750 mg  750 mg Intravenous Once Shon Baton, MD 100 mL/hr at 01/03/14 0048 750 mg at 01/03/14 0048  . levofloxacin  (LEVAQUIN) IVPB 750 mg  750 mg Intravenous Q24H Doree Albee, MD      . methylPREDNISolone sodium succinate (SOLU-MEDROL) 125 mg/2 mL injection 125 mg  125 mg Intravenous Q6H Doree Albee, MD      . sodium chloride 0.9 % injection 3 mL  3 mL Intravenous Q12H Doree Albee, MD       Current Outpatient Prescriptions  Medication Sig Dispense Refill  . amLODipine (NORVASC) 5 MG tablet Take 1 tablet by mouth daily.      Marland Kitchen aspirin EC 81 MG tablet Take 81 mg by mouth daily. For status post cva.      Marland Kitchen atorvastatin (LIPITOR) 20 MG tablet Take 20 mg by mouth every evening.      . baclofen (LIORESAL) 20 MG tablet Take 20 mg by mouth 2 (two) times daily. For gait disturbances,muscle spasm      . cholecalciferol (VITAMIN D) 1000 UNITS tablet Take 1,000 Units by mouth daily.      Marland Kitchen ipratropium-albuterol (DUONEB) 0.5-2.5 (3) MG/3ML SOLN Take 3 mLs by nebulization 3 (three) times daily. For COPD.      Marland Kitchen ipratropium-albuterol (DUONEB) 0.5-2.5 (3) MG/3ML SOLN Take 3 mLs by nebulization every 6 (six) hours as needed (for shortness of breath or wheezing.).      Marland Kitchen lisinopril (PRINIVIL,ZESTRIL) 20 MG tablet Take 1 tablet by mouth daily.      Marland Kitchen LORazepam (ATIVAN) 0.5 MG tablet Take 0.5 mg by mouth every 8 (eight) hours as needed for anxiety.      . meloxicam (MOBIC) 15 MG tablet Take 15 mg by mouth 2 (two) times daily. For gout.      . mirtazapine (REMERON) 30 MG tablet Take 30 mg by mouth at bedtime.      . OXcarbazepine (TRILEPTAL) 150 MG tablet Take 150 mg by mouth 2 (two) times daily.      Marland Kitchen tiotropium (SPIRIVA) 18 MCG inhalation capsule Place 18 mcg into inhaler and inhale daily.       Review Of Systems: 12 point ROS negative except as noted above in HPI.  Physical Exam: Filed Vitals:   01/03/14 0017  BP:   Pulse: 120  Temp:   Resp:     General: obtunded, somnolent, does make eye contact, s/p mag and ativan  HEENT: PERRLA and extra ocular movement intact Heart: S1, S2 normal, no murmur, rub or  gallop, regular rate and rhythm Lungs: expiratory wheezes and rhonchi throughout both lung fields and bipap in place  Abdomen: abdomen is soft without significant tenderness, masses, organomegaly or guarding Extremities: extremities normal, atraumatic, no cyanosis or edema Skin:no rashes Neurology: minimally cooperative to exam   Labs and Imaging: Lab Results  Component Value Date/Time   NA 146 01/02/2014 11:37 PM   K 4.4 01/02/2014 11:37 PM   CL 101 01/02/2014 11:37 PM   CO2 34* 01/02/2014 11:37 PM   BUN 25* 01/02/2014 11:37 PM   CREATININE 0.78 01/02/2014 11:37 PM   GLUCOSE 232* 01/02/2014 11:37 PM   Lab Results  Component Value Date   WBC  8.8 01/02/2014   HGB 16.3 01/02/2014   HCT 49.8 01/02/2014   MCV 92.7 01/02/2014   PLT 170 01/02/2014    Dg Chest Portable 1 View  01/03/2014   CLINICAL DATA:  COPD, respiratory distress  EXAM: PORTABLE CHEST - 1 VIEW  COMPARISON:  08/03/2013  FINDINGS: The heart size and mediastinal contours are within normal limits. Both lungs are clear. The visualized skeletal structures are unremarkable.  IMPRESSION: No active disease.   Electronically Signed   By: Esperanza Heir M.D.   On: 01/03/2014 00:33           Doree Albee MD  Pager: [phone]

## 2014-01-03 NOTE — Care Management Note (Addendum)
    Page 1 of 1   01/06/2014     1:03:47 PM CARE MANAGEMENT NOTE 01/06/2014  Patient:  Jason Cisneros, Jason Cisneros   Account Number:  [id number]  Date Initiated:  01/03/2014  Documentation initiated by:  Sharrie Rothman  Subjective/Objective Assessment:   Pt admitted from [facility] with respiratory failure. Pt will return to facility at discharge.     Action/Plan:   CSW is aware and will arrange discharge once medically stable.   Anticipated DC Date:  01/10/2014   Anticipated DC Plan:  SKILLED NURSING FACILITY  In-house referral  Clinical Social Worker      DC Planning Services  CM consult      Choice offered to / List presented to:             Status of service:  Completed, signed off Medicare Important Message given?  YES (If response is "NO", the following Medicare IM given date fields will be blank) Date Medicare IM given:  01/06/2014 Medicare IM given by:  Sharrie Rothman Date Additional Medicare IM given:   Additional Medicare IM given by:    Discharge Disposition:  SKILLED NURSING FACILITY  Per UR Regulation:    If discussed at Long Length of Stay Meetings, dates discussed:    Comments:  01/06/14 1300 Arlyss Queen, RN BSN CM Pt discharged back to [facility]. CSW to arrange discharge to facility. Facility is to arrange Hospice referral once discussed with guardian.  01/03/14 0915 Arlyss Queen, RN BSN CM

## 2014-01-03 NOTE — Progress Notes (Signed)
UR chart review completed.  

## 2014-01-03 NOTE — ED Notes (Signed)
Report given to Dorothy, RN in ICU 

## 2014-01-03 NOTE — ED Notes (Signed)
CRITICAL VALUE ALERT  Critical value received:  abg ph 7.26 pco2 68.7  PO2 90.2  Bicarb 30.0 sat 95.8   Date of notification:  01/03/2014  Time of notification:  0112  Critical value read back:Yes.    Nurse who received alert:  Bronson Curb, RN  MD notified (1st page):  Dr. Wilkie Aye  Time of first page:  0112  MD notified (2nd page):  Time of second page:  Responding MD:  Dr Wilkie Aye  Time MD responded:  [phone]

## 2014-01-03 NOTE — Evaluation (Signed)
Clinical/Bedside Swallow Evaluation Patient Details  Name: Jason Cisneros MRN: [id number] Date of Birth: 03-27-1956  Today's Date: 01/03/2014 Time: 4098-1191 SLP Time Calculation (min): 59 min  Past Medical History:  Past Medical History  Diagnosis Date  . Back pain   . Dementia   . Hyperlipidemia   . Gait disturbance   . Depressive disorder   . COPD (chronic obstructive pulmonary disease)   . Dysphagia   . CVA (cerebral infarction)   . Stroke   . Hypertension    Past Surgical History:  Past Surgical History  Procedure Laterality Date  . Cholecystectomy     HPI:  Mr. Jason Cisneros is a 58 y.o. year old male with significant past medical history of COPD, hypertension, CVA presenting with acute respiratory failure with hypoxia and hypercarbia. Level V caveat as patient is acutely in respiratory distress and is nonverbal at baseline. Per report, patient with worsening respiratory status over the past 24 hours at his skilled nursing facility. Was given Solu-Medrol Xopenex at the living facility per her report with minimal improvement in symptoms. Still smoking daily. Patient was noted to be satting in the mid 70s on 10 L. He is a resident at [facility]. Pt is currently NPO. SLP spoke with SLP at [facility] and picked up his computer to facilitate communication. He reportedly consumes puree diet with thin liquids, but always sounds wet/congested. Pt has severe/profound dysarthria negatively impacting expressive language skills. He uses a computer to communicate and writing on paper.    Assessment / Plan / Recommendation Clinical Impression  Jason Cisneros was found to be wet/gurgly upon SLP arrival and in need of suction. RN reports that he was not like that earlier and brought suction/listened to lungs. Oral suctioning yielded clear thin secretions, but congestion further into pharynx/larynx could not be removed (despite cues to cough). Minimal po trials attempted due to severity of congestion and  inability to clear/protect airway. Swallow initiation is delayed and hyolaryngeal excursion is reduced; immediate coughing after ice chip and delayed cough and throat clear after puree. Pt nods head in agreement when asked if he feels stasis in pharynx. Pt emotionally labile throughout the session, moments of crying intermixed with laughter. Pt shakes his head "no" when asked if in pain, sad, or scared. He is clearly frustrated about his inability to communicate and attempted to use paper and pen with marginal success. It was determined that he uses a lap top to communicate at [facility] and he wished for SLP to go get it (I did and it is now in his room with charger). Treating SLP at [facility] reports that she has had concerns about aspiration since he had been there and always has wet vocal quality. There are no records of SLP intervention in [facility] system so he likely was treated for dysphagia wherever he was admitted for his previous stroke. Recommend NPO with objective assessment tomorrow (MBSS). Can give po meds crushed in puree if necessary. Above to pt and RN. SLP will follow Wednesday.    Aspiration Risk  Severe    Diet Recommendation NPO except meds   Medication Administration: Crushed with puree    Other  Recommendations Recommended Consults: MBS Oral Care Recommendations: Oral care Q4 per protocol   Follow Up Recommendations  Skilled Nursing facility    Frequency and Duration min 2x/week  2 weeks   Pertinent Vitals/Pain No pain    SLP Swallow Goals  pending MBSS tomorrow   Swallow Study Prior Functional Status  General Date of Onset: 01/02/14 HPI: Jason Cisneros is a 58 y.o. year old male with significant past medical history of COPD, hypertension, CVA presenting with acute respiratory failure with hypoxia and hypercarbia. Level V caveat as patient is acutely in respiratory distress and is nonverbal at baseline. Per report, patient with worsening respiratory status over  the past 24 hours at his skilled nursing facility. Was given Solu-Medrol Xopenex at the living facility per her report with minimal improvement in symptoms. Still smoking daily. Patient was noted to be satting in the mid 70s on 10 L. He is a resident at [facility]. Pt is currently NPO. SLP spoke with SLP at [facility] and picked up his computer to facilitate communication. He reportedly consumes puree diet with thin liquids, but always sounds wet/congested. Pt has severe/profound dysarthria negatively impacting expressive language skills. He uses a computer to communicate and writing on paper.  Type of Study: Bedside swallow evaluation Previous Swallow Assessment: None on record Diet Prior to this Study: NPO;Other (Comment) (Puree and thin at [facility] PTA) Temperature Spikes Noted: No Respiratory Status: Nasal cannula History of Recent Intubation: No Behavior/Cognition: Alert;Cooperative;Pleasant mood;Requires cueing Oral Cavity - Dentition: Edentulous Self-Feeding Abilities: Needs assist Patient Positioning: Upright in bed Baseline Vocal Quality: Wet (dysarthric) Volitional Cough: Weak;Congested;Wet Volitional Swallow: Unable to elicit    Oral/Motor/Sensory Function Overall Oral Motor/Sensory Function: Impaired Labial ROM: Within Functional Limits Labial Symmetry: Within Functional Limits Labial Strength: Within Functional Limits Labial Sensation: Within Functional Limits Lingual ROM: Within Functional Limits Lingual Symmetry: Within Functional Limits Lingual Strength: Reduced Velum: Impaired right;Impaired left Mandible: Within Functional Limits   Ice Chips Ice chips: Impaired Presentation: Spoon Pharyngeal Phase Impairments: Suspected delayed Swallow;Decreased hyoid-laryngeal movement;Wet Vocal Quality;Throat Clearing - Delayed;Cough - Immediate   Thin Liquid Thin Liquid: Not tested    Nectar Thick Nectar Thick Liquid: Not tested   Honey Thick Honey Thick Liquid: Not tested   Puree Puree:  Impaired Presentation: Spoon Oral Phase Impairments: Reduced lingual movement/coordination Pharyngeal Phase Impairments: Suspected delayed Swallow;Decreased hyoid-laryngeal movement;Multiple swallows;Throat Clearing - Delayed;Cough - Delayed;Wet Vocal Quality   Solid   Thank you,  Havery Moros, CCC-SLP [phone]     Solid: Not tested       PORTER,DABNEY 01/03/2014,3:19 PM

## 2014-01-03 NOTE — Progress Notes (Signed)
The patient is a 58 year old man with a history of COPD, a stroke with resultant hemiparesis and muteness who was admitted early this morning for hypertensive urgency and acute on chronic respiratory failure from COPD exacerbation. He was briefly seen and examined. His chart, vital signs, and laboratory studies were reviewed. Agree with current management, but will:  1. Check a followup ABG off of BiPAP and on room air. 2. Decrease Solu-Medrol to 125 mg every 6 hours to 80 mg every 6 hours. 3. Add half-normal saline because of hypernatremia. 4. Add sliding scale NovoLog and check hemoglobin A1c. 5. Check TSH and vitamin B12 level.

## 2014-01-04 ENCOUNTER — Inpatient Hospital Stay (HOSPITAL_COMMUNITY): Payer: Medicare Other

## 2014-01-04 LAB — COMPREHENSIVE METABOLIC PANEL
ALK PHOS: 54 U/L (ref 39–117)
ALT: 21 U/L (ref 0–53)
ANION GAP: 8 (ref 5–15)
AST: 16 U/L (ref 0–37)
Albumin: 3.3 g/dL — ABNORMAL LOW (ref 3.5–5.2)
BUN: 30 mg/dL — AB (ref 6–23)
CHLORIDE: 105 meq/L (ref 96–112)
CO2: 32 meq/L (ref 19–32)
Calcium: 8.9 mg/dL (ref 8.4–10.5)
Creatinine, Ser: 0.79 mg/dL (ref 0.50–1.35)
GLUCOSE: 98 mg/dL (ref 70–99)
Potassium: 4.7 mEq/L (ref 3.7–5.3)
Sodium: 145 mEq/L (ref 137–147)
Total Bilirubin: 0.2 mg/dL — ABNORMAL LOW (ref 0.3–1.2)
Total Protein: 5.8 g/dL — ABNORMAL LOW (ref 6.0–8.3)

## 2014-01-04 LAB — CBC WITH DIFFERENTIAL/PLATELET
Basophils Absolute: 0 10*3/uL (ref 0.0–0.1)
Basophils Relative: 0 % (ref 0–1)
Eosinophils Absolute: 0 10*3/uL (ref 0.0–0.7)
Eosinophils Relative: 0 % (ref 0–5)
HEMATOCRIT: 37.7 % — AB (ref 39.0–52.0)
Hemoglobin: 12.1 g/dL — ABNORMAL LOW (ref 13.0–17.0)
Lymphocytes Relative: 14 % (ref 12–46)
Lymphs Abs: 1.2 10*3/uL (ref 0.7–4.0)
MCH: 29.6 pg (ref 26.0–34.0)
MCHC: 32.1 g/dL (ref 30.0–36.0)
MCV: 92.2 fL (ref 78.0–100.0)
MONO ABS: 1 10*3/uL (ref 0.1–1.0)
Monocytes Relative: 12 % (ref 3–12)
NEUTROS PCT: 74 % (ref 43–77)
Neutro Abs: 6.2 10*3/uL (ref 1.7–7.7)
Platelets: 120 10*3/uL — ABNORMAL LOW (ref 150–400)
RBC: 4.09 MIL/uL — ABNORMAL LOW (ref 4.22–5.81)
RDW: 13.7 % (ref 11.5–15.5)
WBC: 8.4 10*3/uL (ref 4.0–10.5)

## 2014-01-04 LAB — GLUCOSE, CAPILLARY
Glucose-Capillary: 104 mg/dL — ABNORMAL HIGH (ref 70–99)
Glucose-Capillary: 100 mg/dL — ABNORMAL HIGH (ref 70–99)
Glucose-Capillary: 120 mg/dL — ABNORMAL HIGH (ref 70–99)
Glucose-Capillary: 119 mg/dL — ABNORMAL HIGH (ref 70–99)
Glucose-Capillary: 144 mg/dL — ABNORMAL HIGH (ref 70–99)

## 2014-01-04 LAB — URINE CULTURE
CULTURE: NO GROWTH
Colony Count: NO GROWTH

## 2014-01-04 LAB — VITAMIN B12: VITAMIN B 12: 461 pg/mL (ref 211–911)

## 2014-01-04 MED ORDER — GUAIFENESIN ER 600 MG PO TB12
1200.0000 mg | ORAL_TABLET | Freq: Two times a day (BID) | ORAL | Status: DC
  Administered 2014-01-06: 1200 mg via ORAL
  Filled 2014-01-04: qty 2

## 2014-01-04 NOTE — Progress Notes (Signed)
[facility] PROGRESS NOTE  Jason Cisneros ZOX:[id number] DOB: [birth year] DOA: 01/02/2014 PCP: Pearson Grippe, MD  Assessment/Plan: 1. Acute respiratory failure with hypoxia and hypercarbia. Admitted with shortness of breath requiring BiPAP. Felt to be related to COPD exacerbation. Clinically appears to be improving. Now off BiPAP and on nasal cannula. We will try to titrate oxygen down as tolerated. Seen by speech therapy and is currently n.p.o. due to concerns for possible aspiration 2. COPD exacerbation. Currently on steroids, antibiotics and bronchodilators. Still appears to be somewhat short of breath. We'll add flutter valve. 3. Hypertensive urgency. Appears to have improved. Continue current treatment 4. Prior history of stroke with resultant hemiparesis and muteness. Patient is essentially wheelchair bound at the nursing home. He communicates via typing into computer.  Code Status: full code Family Communication: no family present Disposition Plan: discharge back to [facility] when stable   Consultants:    Procedures:    Antibiotics:  Levofloxacin 9/22  HPI/Subjective: Patient is nonverbal at baseline from prior stroke.  He communicates by typing on his computer  Objective: Filed Vitals:   01/04/14 0900  BP: 137/68  Pulse: 66  Temp:   Resp: 30    Intake/Output Summary (Last 24 hours) at 01/04/14 1055 Last data filed at 01/04/14 0900  Gross per 24 hour  Intake 1917.5 ml  Output    550 ml  Net 1367.5 ml   Filed Weights   01/02/14 2344 01/03/14 0300 01/04/14 0448  Weight: 63.504 kg (140 lb) 57 kg (125 lb 10.6 oz) 57.8 kg (127 lb 6.8 oz)    Exam:   General:  Mild increased work of breathing  Cardiovascular: S1, S2 RRR  Respiratory: bilateral rhonchi, no wheezing  Abdomen: soft, nt, nd, bs+  Musculoskeletal: no edema b/l   Data Reviewed: Basic Metabolic Panel:  Recent Labs Lab 01/02/14 2337 01/03/14 0427 01/04/14 0820  NA 146 148* 145  K 4.4  4.4 4.7  CL 101 104 105  CO2 34* 32 32  GLUCOSE 232* 186* 98  BUN 25* 28* 30*  CREATININE 0.78 0.83 0.79  CALCIUM 9.6 8.8 8.9   Liver Function Tests:  Recent Labs Lab 01/03/14 0427 01/04/14 0820  AST 22 16  ALT 29 21  ALKPHOS 68 54  BILITOT <0.2* 0.2*  PROT 6.3 5.8*  ALBUMIN 3.7 3.3*   No results found for this basename: LIPASE, AMYLASE,  in the last 168 hours No results found for this basename: AMMONIA,  in the last 168 hours CBC:  Recent Labs Lab 01/02/14 2337 01/03/14 0427 01/04/14 0820  WBC 8.8 12.6* 8.4  NEUTROABS 7.8* 11.7* 6.2  HGB 16.3 14.1 12.1*  HCT 49.8 43.5 37.7*  MCV 92.7 92.4 92.2  PLT 170 141* 120*   Cardiac Enzymes:  Recent Labs Lab 01/02/14 2337 01/03/14 0205 01/03/14 0809 01/03/14 1408  TROPONINI <0.30 <0.30 <0.30 <0.30   BNP (last 3 results)  Recent Labs  01/02/14 2337  PROBNP 231.3*   CBG:  Recent Labs Lab 01/03/14 1138 01/03/14 1638 01/03/14 2104 01/04/14 0741  GLUCAP 147* 132* 124* 104*    Recent Results (from the past 240 hour(s))  CULTURE, BLOOD (ROUTINE X 2)     Status: None   Collection Time    01/03/14 12:16 AM      Result Value Ref Range Status   Specimen Description BLOOD LEFT FOREARM   Final   Special Requests     Final   Value: BOTTLES DRAWN AEROBIC AND ANAEROBIC AEB=8CC ANA=4CC  Culture NO GROWTH 1 DAY   Final   Report Status PENDING   Incomplete  CULTURE, BLOOD (ROUTINE X 2)     Status: None   Collection Time    01/03/14 12:16 AM      Result Value Ref Range Status   Specimen Description BLOOD LEFT HAND   Final   Special Requests     Final   Value: BOTTLES DRAWN AEROBIC AND ANAEROBIC AEB=8CC ANA=6CC   Culture NO GROWTH 1 DAY   Final   Report Status PENDING   Incomplete  URINE CULTURE     Status: None   Collection Time    01/03/14  3:00 AM      Result Value Ref Range Status   Specimen Description URINE, CLEAN CATCH   Final   Special Requests NONE   Final   Culture  Setup Time     Final   Value:  01/03/2014 10:30     Performed at [facility] Count     Final   Value: NO GROWTH     Performed at [facility]   Culture     Final   Value: NO GROWTH     Performed at [facility]   Report Status 01/04/2014 FINAL   Final  MRSA PCR SCREENING     Status: None   Collection Time    01/03/14  3:00 AM      Result Value Ref Range Status   MRSA by PCR NEGATIVE  NEGATIVE Final   Comment:            The GeneXpert MRSA Assay (FDA     approved for NASAL specimens     only), is one component of a     comprehensive MRSA colonization     surveillance program. It is not     intended to diagnose MRSA     infection nor to guide or     monitor treatment for     MRSA infections.     Studies: Dg Chest Portable 1 View  01/03/2014   CLINICAL DATA:  COPD, respiratory distress  EXAM: PORTABLE CHEST - 1 VIEW  COMPARISON:  08/03/2013  FINDINGS: The heart size and mediastinal contours are within normal limits. Both lungs are clear. The visualized skeletal structures are unremarkable.  IMPRESSION: No active disease.   Electronically Signed   By: Esperanza Heir M.D.   On: 01/03/2014 00:33    Scheduled Meds: . famotidine (PEPCID) IV  20 mg Intravenous Q24H  . heparin  5,000 Units Subcutaneous 3 times per day  . insulin aspart  0-20 Units Subcutaneous TID WC  . insulin aspart  0-5 Units Subcutaneous QHS  . ipratropium-albuterol  3 mL Nebulization Q4H  . levofloxacin (LEVAQUIN) IV  750 mg Intravenous Q24H  . methylPREDNISolone (SOLU-MEDROL) injection  80 mg Intravenous Q6H  . sodium chloride  3 mL Intravenous Q12H   Continuous Infusions: . 0.45 % NaCl with KCl 20 mEq / L 75 mL/hr at 01/03/14 2300  . sodium chloride Stopped (01/03/14 1009)    Active Problems:   Acute respiratory failure with hypoxia and hypercarbia   Thrombocytopenia, unspecified   Hypernatremia   Hyperglycemia   History of CVA (cerebrovascular accident)    Time spent:    [facility]  [facility] Pager [phone]. If 7PM-7AM, please contact night-coverage at [email or web address], password [facility] 01/04/2014, 10:55 AM  LOS: 2 days

## 2014-01-04 NOTE — Progress Notes (Signed)
Patient refuses to have morning blood draws. Refused to [employer]. (phlebotomist) and refused to me in person and in witness of [place].In writing, which is his form of communication. Pt was informed that this could delay treatment (and he was A&O x3), and still refuses to allow blood draws.

## 2014-01-04 NOTE — Progress Notes (Signed)
Patient with new orders for Mucinex. Patient is NPO due to failing swallow evaluation. Patient is due for barium swallow study today. Notified Dr. Kerry Hough about patient not taking mucinex.

## 2014-01-04 NOTE — Consult Note (Signed)
[address] A. Merlene Laughter, MD     [email or web address]          Jason Cisneros is an 58 y.o. male.   ASSESSMENT/PLAN: 1. Chronic progressive gait impairment. Now associated with dysphagia. The combination of upper motor neuron findings involving the legs and lower motor neuron findings in both upper extremities is very worrisome for motor neuron disease/ALS. ALS could also did explain the patient's dysphagia and the dyspnea/elevated PCO2. To diagnose this the patient will have to have a dedicated and extensive nerve conduction study and needle electromyography. Unfortunate, this is not available inpatient. This could be arranged on discharge. Limited neuromuscular evaluation will be done including CPK and acetylcholine receptor antibodies.  2. Long-standing history of dysarthria which developed acutely in 1995. This is thought to be related to lacunar infarcts. Continue with aspirin antiplatelet agents, lipid medication and blood pressure control. He has had multiple repeat imaging in the past but is reasonable to repeat an MRI of the brain.  3. 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   The patient is a 58 year old man who we saw in July 2014 for about 18 month history of worsening of progressive gait impairment. We were impressed by evidence of upper motor neuron findings along the legs suspicious for cervical myelopathy. ( copies of my Office notes are on the paper chart). A cervical spine MRI showed severe multilevel facet disease but no central canal stenosis causing myelopathy. The patient was also noted to be mildly parkinsonian but this was thought not to be severe enough to cause the patient's gait impairment. The patient has been lost to follow-up. He does have a long-standing history of dysarthria which happened rather abruptly on awakening one day in 1995. Since then, he has had dysarthria which has not improved. The patient  presented to the hospital with acute shortness of breath and dyspnea with elevated PCO2. The thought is that the patient's PCO2 is due to worsening COPD. History difficult because of severe dysarthria. The patient was on a BiPAP but this is being weaned. He actually will be placed on BiPAP at nighttime. He is currently on nasal cannular. It appears that the patient has had a fluctuation in the motion witnessed by the therapist. In fact, I did call in the office several months ago from one of the physical therapist who also had this concern for the patient having pseudobulbar affect. The patient currently does not have any complaints but he is anxious to start eating and drinking again.  GENERAL: Thin in no acute distress.  HEENT: Supple. Atraumatic normocephalic.   ABDOMEN: soft  EXTREMITIES: No edema   BACK: Normal.  SKIN: Normal by inspection.    MENTAL STATUS: Profound dysarthria which makes speech totally incomprehendible.   CRANIAL NERVES: Pupils are equal, round and reactive to light and accommodation; extra ocular movements are full, there is no significant nystagmus; visual fields are full; upper and lower facial muscles are normal in strength and symmetric, there is no flattening of the nasolabial folds; tongue is midline; uvula is midline; shoulder elevation is normal.  MOTOR: Deltoids are 3/5 bilaterally which is worse compared to my initial exam on the patient July 2014. At that time it was 4/5. The patient clearly has marked atrophy of the intrinsic hand muscles bilaterally. There is constant fasciculations noted of the first dorsal interossei muscles bilaterally. Handgrip is 2/5. There is markedly increased tone again involving the legs although strength is 5/5.  There is some quivering of the tongue but no sense of clear fasciculations.  COORDINATION: Left finger to nose is normal, right finger to nose is normal, No rest tremor; no intention tremor; no postural tremor; no  bradykinesia.  REFLEXES: Deep tendon reflexes are symmetrical and normal In the upper extremities but clearly pathologically brisk 3+ in the legs. Babinski reflexes are Extensor bilaterally.   SENSATION: Normal to light pain.      Blood pressure 121/71, pulse 69, temperature 98.7 F (37.1 C), temperature source Oral, resp. rate 26, height 5' 5"  (1.651 m), weight 57.8 kg (127 lb 6.8 oz), SpO2 96.00%.  Past Medical History  Diagnosis Date  . Back pain   . Dementia   . Hyperlipidemia   . Gait disturbance   . Depressive disorder   . COPD (chronic obstructive pulmonary disease)   . Dysphagia   . CVA (cerebral infarction)   . Stroke   . Hypertension     Past Surgical History  Procedure Laterality Date  . Cholecystectomy      History reviewed. No pertinent family history.  Social History:  reports that he has been smoking Cigarettes.  He has been smoking about 0.10 packs per day. He does not have any smokeless tobacco history on file. He reports that he does not drink alcohol. His drug history is not on file.  Allergies: No Known Allergies  Medications: Prior to Admission medications   Medication Sig Start Date End Date Taking? Authorizing Provider  amLODipine (NORVASC) 5 MG tablet Take 1 tablet by mouth daily. 07/25/13  Yes Historical Provider, MD  aspirin EC 81 MG tablet Take 81 mg by mouth daily. For status post cva.   Yes Historical Provider, MD  atorvastatin (LIPITOR) 20 MG tablet Take 20 mg by mouth every evening.   Yes Historical Provider, MD  baclofen (LIORESAL) 20 MG tablet Take 20 mg by mouth 2 (two) times daily. For gait disturbances,muscle spasm   Yes Historical Provider, MD  budesonide (PULMICORT) 0.5 MG/2ML nebulizer solution Take 0.5 mg by nebulization 2 (two) times daily.   Yes Historical Provider, MD  cholecalciferol (VITAMIN D) 1000 UNITS tablet Take 1,000 Units by mouth daily.   Yes Historical Provider, MD  levalbuterol (XOPENEX) 0.31 MG/3ML nebulizer  solution Take 1 ampule by nebulization 3 (three) times daily.   Yes Historical Provider, MD  lisinopril (PRINIVIL,ZESTRIL) 20 MG tablet Take 1 tablet by mouth daily. 04/16/13  Yes Historical Provider, MD  meloxicam (MOBIC) 15 MG tablet Take 15 mg by mouth 2 (two) times daily. For gout.   Yes Historical Provider, MD  methylPREDNISolone sodium succinate (SOLU-MEDROL) 125 mg/2 mL injection Inject 125 mg into the muscle once.   Yes Historical Provider, MD  mirtazapine (REMERON) 30 MG tablet Take 30 mg by mouth at bedtime.   Yes Historical Provider, MD  nicotine (NICODERM CQ - DOSED IN MG/24 HOURS) 21 mg/24hr patch Place 21 mg onto the skin daily.   Yes Historical Provider, MD  Oxcarbazepine (TRILEPTAL) 300 MG tablet Take 300 mg by mouth 2 (two) times daily.   Yes Historical Provider, MD  LORazepam (ATIVAN) 0.5 MG tablet Take 0.5 mg by mouth every 8 (eight) hours as needed for anxiety.    Historical Provider, MD    Scheduled Meds: . famotidine (PEPCID) IV  20 mg Intravenous Q24H  . guaiFENesin  1,200 mg Oral BID  . heparin  5,000 Units Subcutaneous 3 times per day  . insulin aspart  0-20 Units Subcutaneous TID WC  .  insulin aspart  0-5 Units Subcutaneous QHS  . ipratropium-albuterol  3 mL Nebulization Q4H  . levofloxacin (LEVAQUIN) IV  750 mg Intravenous Q24H  . methylPREDNISolone (SOLU-MEDROL) injection  80 mg Intravenous Q6H  . sodium chloride  3 mL Intravenous Q12H   Continuous Infusions: . 0.45 % NaCl with KCl 20 mEq / L 75 mL/hr at 01/04/14 1900   PRN Meds:.hydrALAZINE, ipratropium-albuterol, LORazepam     Results for orders placed during the hospital encounter of 01/02/14 (from the past 48 hour(s))  CBC WITH DIFFERENTIAL     Status: Abnormal   Collection Time    01/02/14 11:37 PM      Result Value Ref Range   WBC 8.8  4.0 - 10.5 K/uL   RBC 5.37  4.22 - 5.81 MIL/uL   Hemoglobin 16.3  13.0 - 17.0 g/dL   HCT 49.8  39.0 - 52.0 %   MCV 92.7  78.0 - 100.0 fL   MCH 30.4  26.0 - 34.0 pg    MCHC 32.7  30.0 - 36.0 g/dL   RDW 13.2  11.5 - 15.5 %   Platelets 170  150 - 400 K/uL   Neutrophils Relative % 89 (*) 43 - 77 %   Neutro Abs 7.8 (*) 1.7 - 7.7 K/uL   Lymphocytes Relative 10 (*) 12 - 46 %   Lymphs Abs 0.9  0.7 - 4.0 K/uL   Monocytes Relative 1 (*) 3 - 12 %   Monocytes Absolute 0.1  0.1 - 1.0 K/uL   Eosinophils Relative 0  0 - 5 %   Eosinophils Absolute 0.0  0.0 - 0.7 K/uL   Basophils Relative 0  0 - 1 %   Basophils Absolute 0.0  0.0 - 0.1 K/uL  BASIC METABOLIC PANEL     Status: Abnormal   Collection Time    01/02/14 11:37 PM      Result Value Ref Range   Sodium 146  137 - 147 mEq/L   Potassium 4.4  3.7 - 5.3 mEq/L   Chloride 101  96 - 112 mEq/L   CO2 34 (*) 19 - 32 mEq/L   Glucose, Bld 232 (*) 70 - 99 mg/dL   BUN 25 (*) 6 - 23 mg/dL   Creatinine, Ser 0.78  0.50 - 1.35 mg/dL   Calcium 9.6  8.4 - 10.5 mg/dL   GFR calc non Af Amer >90  >90 mL/min   GFR calc Af Amer >90  >90 mL/min   Comment: (NOTE)     The eGFR has been calculated using the CKD EPI equation.     This calculation has not been validated in all clinical situations.     eGFR's persistently <90 mL/min signify possible Chronic Kidney     Disease.   Anion gap 11  5 - 15  PRO B NATRIURETIC PEPTIDE     Status: Abnormal   Collection Time    01/02/14 11:37 PM      Result Value Ref Range   Pro B Natriuretic peptide (BNP) 231.3 (*) 0 - 125 pg/mL  TROPONIN I     Status: None   Collection Time    01/02/14 11:37 PM      Result Value Ref Range   Troponin I <0.30  <0.30 ng/mL   Comment:            Due to the release kinetics of cTnI,     a negative result within the first hours     of the onset  of symptoms does not rule out     myocardial infarction with certainty.     If myocardial infarction is still suspected,     repeat the test at appropriate intervals.  LACTIC ACID, PLASMA     Status: None   Collection Time    01/02/14 11:38 PM      Result Value Ref Range   Lactic Acid, Venous 1.1  0.5 - 2.2  mmol/L  CULTURE, BLOOD (ROUTINE X 2)     Status: None   Collection Time    01/03/14 12:16 AM      Result Value Ref Range   Specimen Description BLOOD LEFT FOREARM     Special Requests       Value: BOTTLES DRAWN AEROBIC AND ANAEROBIC AEB=8CC ANA=4CC   Culture NO GROWTH 1 DAY     Report Status PENDING    CULTURE, BLOOD (ROUTINE X 2)     Status: None   Collection Time    01/03/14 12:16 AM      Result Value Ref Range   Specimen Description BLOOD LEFT HAND     Special Requests       Value: BOTTLES DRAWN AEROBIC AND ANAEROBIC AEB=8CC ANA=6CC   Culture NO GROWTH 1 DAY     Report Status PENDING    BLOOD GAS, ARTERIAL     Status: Abnormal   Collection Time    01/03/14  1:00 AM      Result Value Ref Range   FIO2 0.60     O2 Content 60.0     Delivery systems BILEVEL POSITIVE AIRWAY PRESSURE     Inspiratory PAP 18     Expiratory PAP 5.0     pH, Arterial 7.263 (*) 7.350 - 7.450   pCO2 arterial 68.7 (*) 35.0 - 45.0 mmHg   Comment: CRITICAL RESULT CALLED TO, READ BACK BY AND VERIFIED WITH:     KRISTY BELTON,RN ON 01/03/2014 BY PEVIANY LAWSON,RRT AT 0111.   pO2, Arterial 90.2  80.0 - 100.0 mmHg   Bicarbonate 30.0 (*) 20.0 - 24.0 mEq/L   TCO2 26.9  0 - 100 mmol/L   Acid-Base Excess 3.6 (*) 0.0 - 2.0 mmol/L   O2 Saturation 95.8     Patient temperature 37.0     Collection site RIGHT RADIAL     Drawn by [phone]     Sample type ARTERIAL     Allens test (pass/fail) PASS  PASS  D-DIMER, QUANTITATIVE     Status: None   Collection Time    01/03/14  1:35 AM      Result Value Ref Range   D-Dimer, Quant 0.32  0.00 - 0.48 ug/mL-FEU   Comment:            AT THE INHOUSE ESTABLISHED CUTOFF     VALUE OF 0.48 ug/mL FEU,     THIS ASSAY HAS BEEN DOCUMENTED     IN THE LITERATURE TO HAVE     A SENSITIVITY AND NEGATIVE     PREDICTIVE VALUE OF AT LEAST     98 TO 99%.  THE TEST RESULT     SHOULD BE CORRELATED WITH     AN ASSESSMENT OF THE CLINICAL     PROBABILITY OF DVT / VTE.  TROPONIN I     Status:  None   Collection Time    01/03/14  2:05 AM      Result Value Ref Range   Troponin I <0.30  <0.30 ng/mL   Comment:  Due to the release kinetics of cTnI,     a negative result within the first hours     of the onset of symptoms does not rule out     myocardial infarction with certainty.     If myocardial infarction is still suspected,     repeat the test at appropriate intervals.  URINE RAPID DRUG SCREEN (HOSP PERFORMED)     Status: None   Collection Time    01/03/14  3:00 AM      Result Value Ref Range   Opiates NONE DETECTED  NONE DETECTED   Cocaine NONE DETECTED  NONE DETECTED   Benzodiazepines NONE DETECTED  NONE DETECTED   Amphetamines NONE DETECTED  NONE DETECTED   Tetrahydrocannabinol NONE DETECTED  NONE DETECTED   Barbiturates NONE DETECTED  NONE DETECTED   Comment:            DRUG SCREEN FOR MEDICAL PURPOSES     ONLY.  IF CONFIRMATION IS NEEDED     FOR ANY PURPOSE, NOTIFY LAB     WITHIN 5 DAYS.                LOWEST DETECTABLE LIMITS     FOR URINE DRUG SCREEN     Drug Class       Cutoff (ng/mL)     Amphetamine      1000     Barbiturate      200     Benzodiazepine   185     Tricyclics       631     Opiates          300     Cocaine          300     THC              50  URINALYSIS, ROUTINE W REFLEX MICROSCOPIC     Status: Abnormal   Collection Time    01/03/14  3:00 AM      Result Value Ref Range   Color, Urine YELLOW  YELLOW   APPearance CLEAR  CLEAR   Specific Gravity, Urine >1.030 (*) 1.005 - 1.030   pH 6.0  5.0 - 8.0   Glucose, UA NEGATIVE  NEGATIVE mg/dL   Hgb urine dipstick MODERATE (*) NEGATIVE   Bilirubin Urine NEGATIVE  NEGATIVE   Ketones, ur NEGATIVE  NEGATIVE mg/dL   Protein, ur 100 (*) NEGATIVE mg/dL   Urobilinogen, UA 0.2  0.0 - 1.0 mg/dL   Nitrite NEGATIVE  NEGATIVE   Leukocytes, UA NEGATIVE  NEGATIVE  URINE CULTURE     Status: None   Collection Time    01/03/14  3:00 AM      Result Value Ref Range   Specimen Description URINE,  CLEAN CATCH     Special Requests NONE     Culture  Setup Time       Value: 01/03/2014 10:30     Performed at [facility]       Value: NO GROWTH     Performed at [facility]   Culture       Value: NO GROWTH     Performed at [facility]   Report Status 01/04/2014 FINAL    MRSA PCR SCREENING     Status: None   Collection Time    01/03/14  3:00 AM      Result Value Ref Range   MRSA by PCR NEGATIVE  NEGATIVE  Comment:            The GeneXpert MRSA Assay (FDA     approved for NASAL specimens     only), is one component of a     comprehensive MRSA colonization     surveillance program. It is not     intended to diagnose MRSA     infection nor to guide or     monitor treatment for     MRSA infections.  URINE MICROSCOPIC-ADD ON     Status: Abnormal   Collection Time    01/03/14  3:00 AM      Result Value Ref Range   Squamous Epithelial / LPF FEW (*) RARE   WBC, UA 0-2  <3 WBC/hpf   RBC / HPF 7-10  <3 RBC/hpf   Bacteria, UA MANY (*) RARE   Casts HYALINE CASTS (*) NEGATIVE   Comment: GRANULAR CAST   Urine-Other MUCOUS PRESENT    COMPREHENSIVE METABOLIC PANEL     Status: Abnormal   Collection Time    01/03/14  4:27 AM      Result Value Ref Range   Sodium 148 (*) 137 - 147 mEq/L   Potassium 4.4  3.7 - 5.3 mEq/L   Chloride 104  96 - 112 mEq/L   CO2 32  19 - 32 mEq/L   Glucose, Bld 186 (*) 70 - 99 mg/dL   BUN 28 (*) 6 - 23 mg/dL   Creatinine, Ser 0.83  0.50 - 1.35 mg/dL   Calcium 8.8  8.4 - 10.5 mg/dL   Total Protein 6.3  6.0 - 8.3 g/dL   Albumin 3.7  3.5 - 5.2 g/dL   AST 22  0 - 37 U/L   ALT 29  0 - 53 U/L   Alkaline Phosphatase 68  39 - 117 U/L   Total Bilirubin <0.2 (*) 0.3 - 1.2 mg/dL   GFR calc non Af Amer >90  >90 mL/min   GFR calc Af Amer >90  >90 mL/min   Comment: (NOTE)     The eGFR has been calculated using the CKD EPI equation.     This calculation has not been validated in all clinical situations.     eGFR's  persistently <90 mL/min signify possible Chronic Kidney     Disease.   Anion gap 12  5 - 15  CBC WITH DIFFERENTIAL     Status: Abnormal   Collection Time    01/03/14  4:27 AM      Result Value Ref Range   WBC 12.6 (*) 4.0 - 10.5 K/uL   RBC 4.71  4.22 - 5.81 MIL/uL   Hemoglobin 14.1  13.0 - 17.0 g/dL   HCT 43.5  39.0 - 52.0 %   MCV 92.4  78.0 - 100.0 fL   MCH 29.9  26.0 - 34.0 pg   MCHC 32.4  30.0 - 36.0 g/dL   RDW 13.3  11.5 - 15.5 %   Platelets 141 (*) 150 - 400 K/uL   Neutrophils Relative % 93 (*) 43 - 77 %   Neutro Abs 11.7 (*) 1.7 - 7.7 K/uL   Lymphocytes Relative 5 (*) 12 - 46 %   Lymphs Abs 0.7  0.7 - 4.0 K/uL   Monocytes Relative 2 (*) 3 - 12 %   Monocytes Absolute 0.3  0.1 - 1.0 K/uL   Eosinophils Relative 0  0 - 5 %   Eosinophils Absolute 0.0  0.0 - 0.7 K/uL   Basophils Relative  0  0 - 1 %   Basophils Absolute 0.0  0.0 - 0.1 K/uL  TROPONIN I     Status: None   Collection Time    01/03/14  8:09 AM      Result Value Ref Range   Troponin I <0.30  <0.30 ng/mL   Comment:            Due to the release kinetics of cTnI,     a negative result within the first hours     of the onset of symptoms does not rule out     myocardial infarction with certainty.     If myocardial infarction is still suspected,     repeat the test at appropriate intervals.  TSH     Status: None   Collection Time    01/03/14  8:09 AM      Result Value Ref Range   TSH 1.170  0.350 - 4.500 uIU/mL   Comment: Performed at [place] A1[facility]     Status: Abnormal   Collection Time    01/03/14  8:09 AM      Result Value Ref Range   Hemoglobin A1C 5.9 (*) <5.7 %   Comment: (NOTE)                                                                               According to the ADA Clinical Practice Recommendations for 2011, when     HbA1c is used as a screening test:      >=6.5%   Diagnostic of Diabetes Mellitus               (if abnormal result is confirmed)     5.7-6.4%   Increased  risk of developing Diabetes Mellitus     References:Diagnosis and Classification of Diabetes Mellitus,Diabetes     VFIE,3329,51(OACZY 1):S62-S69 and Standards of Medical Care in             Diabetes - 2011,Diabetes Care,2011,34 (Suppl 1):S11-S61.   Mean Plasma Glucose 123 (*) <117 mg/dL   Comment: Performed at [place], ARTERIAL     Status: Abnormal   Collection Time    01/03/14  9:35 AM      Result Value Ref Range   O2 Content 2.0     Delivery systems NASAL CANNULA     pH, Arterial 7.360  7.350 - 7.450   pCO2 arterial 53.0 (*) 35.0 - 45.0 mmHg   pO2, Arterial 69.9 (*) 80.0 - 100.0 mmHg   Bicarbonate 29.2 (*) 20.0 - 24.0 mEq/L   TCO2 26.2  0 - 100 mmol/L   Acid-Base Excess 4.1 (*) 0.0 - 2.0 mmol/L   O2 Saturation 93.8     Patient temperature 37.0     Collection site RIGHT RADIAL     Drawn by [id number]     Sample type ARTERIAL     Allens test (pass/fail) PASS  PASS  GLUCOSE, CAPILLARY     Status: Abnormal   Collection Time    01/03/14 11:38 AM      Result Value Ref Range   Glucose-Capillary 147 (*) 70 - 99 mg/dL   Comment 1  Documented in Chart     Comment 2 Notify RN    TROPONIN I     Status: None   Collection Time    01/03/14  2:08 PM      Result Value Ref Range   Troponin I <0.30  <0.30 ng/mL   Comment:            Due to the release kinetics of cTnI,     a negative result within the first hours     of the onset of symptoms does not rule out     myocardial infarction with certainty.     If myocardial infarction is still suspected,     repeat the test at appropriate intervals.  VITAMIN B12     Status: None   Collection Time    01/03/14  2:08 PM      Result Value Ref Range   Vitamin B-12 461  211 - 911 pg/mL   Comment: Performed at [place], CAPILLARY     Status: Abnormal   Collection Time    01/03/14  4:38 PM      Result Value Ref Range   Glucose-Capillary 132 (*) 70 - 99 mg/dL   Comment 1 Documented in Chart     Comment 2  Notify RN    GLUCOSE, CAPILLARY     Status: Abnormal   Collection Time    01/03/14  9:04 PM      Result Value Ref Range   Glucose-Capillary 124 (*) 70 - 99 mg/dL  GLUCOSE, CAPILLARY     Status: Abnormal   Collection Time    01/04/14  7:41 AM      Result Value Ref Range   Glucose-Capillary 104 (*) 70 - 99 mg/dL  COMPREHENSIVE METABOLIC PANEL     Status: Abnormal   Collection Time    01/04/14  8:20 AM      Result Value Ref Range   Sodium 145  137 - 147 mEq/L   Potassium 4.7  3.7 - 5.3 mEq/L   Chloride 105  96 - 112 mEq/L   CO2 32  19 - 32 mEq/L   Glucose, Bld 98  70 - 99 mg/dL   BUN 30 (*) 6 - 23 mg/dL   Creatinine, Ser 0.79  0.50 - 1.35 mg/dL   Calcium 8.9  8.4 - 10.5 mg/dL   Total Protein 5.8 (*) 6.0 - 8.3 g/dL   Albumin 3.3 (*) 3.5 - 5.2 g/dL   AST 16  0 - 37 U/L   ALT 21  0 - 53 U/L   Alkaline Phosphatase 54  39 - 117 U/L   Total Bilirubin 0.2 (*) 0.3 - 1.2 mg/dL   GFR calc non Af Amer >90  >90 mL/min   GFR calc Af Amer >90  >90 mL/min   Comment: (NOTE)     The eGFR has been calculated using the CKD EPI equation.     This calculation has not been validated in all clinical situations.     eGFR's persistently <90 mL/min signify possible Chronic Kidney     Disease.   Anion gap 8  5 - 15  CBC WITH DIFFERENTIAL     Status: Abnormal   Collection Time    01/04/14  8:20 AM      Result Value Ref Range   WBC 8.4  4.0 - 10.5 K/uL   RBC 4.09 (*) 4.22 - 5.81 MIL/uL   Hemoglobin 12.1 (*) 13.0 - 17.0 g/dL  HCT 37.7 (*) 39.0 - 52.0 %   MCV 92.2  78.0 - 100.0 fL   MCH 29.6  26.0 - 34.0 pg   MCHC 32.1  30.0 - 36.0 g/dL   RDW 13.7  11.5 - 15.5 %   Platelets 120 (*) 150 - 400 K/uL   Neutrophils Relative % 74  43 - 77 %   Neutro Abs 6.2  1.7 - 7.7 K/uL   Lymphocytes Relative 14  12 - 46 %   Lymphs Abs 1.2  0.7 - 4.0 K/uL   Monocytes Relative 12  3 - 12 %   Monocytes Absolute 1.0  0.1 - 1.0 K/uL   Eosinophils Relative 0  0 - 5 %   Eosinophils Absolute 0.0  0.0 - 0.7 K/uL    Basophils Relative 0  0 - 1 %   Basophils Absolute 0.0  0.0 - 0.1 K/uL  GLUCOSE, CAPILLARY     Status: Abnormal   Collection Time    01/04/14 11:44 AM      Result Value Ref Range   Glucose-Capillary 100 (*) 70 - 99 mg/dL  GLUCOSE, CAPILLARY     Status: Abnormal   Collection Time    01/04/14  4:37 PM      Result Value Ref Range   Glucose-Capillary 120 (*) 70 - 99 mg/dL  GLUCOSE, CAPILLARY     Status: Abnormal   Collection Time    01/04/14  5:44 PM      Result Value Ref Range   Glucose-Capillary 119 (*) 70 - 99 mg/dL    Studies/Results:  CSPINE MRI 12-2013 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 In this patient with left  upper extremity radicular symptoms, stenosis potentially affecting  the left C8 nerve is the most pronounced, with potential involvement  of the left C5 and C4 nerves as well.   BRAIN MRI 2006 1. Mild to moderate small vessel white matter ischemic changes in both cerebral  hemispheres.  2. Prominent perivascular space or old lacunar infarct at the inferior aspect of  the basal ganglia on the right.  3. Chronic right maxillary sinusitis.   Jmarion Christiano A. Merlene Laughter, M.D.  Diplomate, Tax adviser of Psychiatry and Neurology ( Neurology). 01/04/2014, 7:30 PM

## 2014-01-04 NOTE — Procedures (Signed)
Objective Swallowing Evaluation: Modified Barium Swallowing Study  Patient Details  Name: Jason Cisneros MRN: [id number] Date of Birth: 05-23-1955  Today's Date: 01/04/2014 Time: 1330-1400 SLP Time Calculation (min): 30 min  Past Medical History:  Past Medical History  Diagnosis Date  . Back pain   . Dementia   . Hyperlipidemia   . Gait disturbance   . Depressive disorder   . COPD (chronic obstructive pulmonary disease)   . Dysphagia   . CVA (cerebral infarction)   . Stroke   . Hypertension    Past Surgical History:  Past Surgical History  Procedure Laterality Date  . Cholecystectomy     HPI:  [name] is a 58 y.o. year old male with significant past medical history of COPD, hypertension, CVA presenting with acute respiratory failure with hypoxia and hypercarbia. Level V caveat as patient is acutely in respiratory distress and is nonverbal at baseline. Per report, patient with worsening respiratory status over the past 24 hours at his skilled nursing facility. Was given Solu-Medrol Xopenex at the living facility per her report with minimal improvement in symptoms. Still smoking daily. Patient was noted to be satting in the mid 70s on 10 L. He is a resident at [facility]. Pt is currently NPO. SLP spoke with SLP at [facility] and picked up his computer to facilitate communication. He reportedly consumes puree diet with thin liquids, but always sounds wet/congested. Pt has severe/profound dysarthria negatively impacting expressive language skills. He uses a computer to communicate and writing on paper.      Assessment / Plan / Recommendation Clinical Impression  Dysphagia Diagnosis: Severe pharyngeal phase dysphagia Clinical impression: Mr. Blatt presents with severe pharyngeal phase dysphagia characterized by premature spillage to the valleculae, decreased tongue base retraction, epiglottic deflection, hyolaryngeal excursion, and CP relaxation resulting in overall decreased  pharyngeal pressure with stasis of bolus in valleculae and pyriforms after the swallow and inability to clear. Pt with severe difficulty passing puree through UES. He was more successful in swallowing thin and nectars (through UES) via sequential swallows, however he was also more likely to aspirate the liquids. ~50% of the liquid bolus passed through UES while the remaining was retained in pharynx eventually leading to penetration and aspiration. Pt was made efforts to cough to clear aspirates, however was unsuccessful due to poor respiratory effort. Pt is judged to be at high risk for aspiration of all consistencies and textures with the majority of the bolus left in pharynx and pt unable to clear. Recommend NPO with alternative source of nutrition, long term. SLP called and spoke to his friend, [name] (former fiance/girlfriend) who reports that pt has resided at [facility] for ~6 months. She reports that he has been quick to cry (labile) for years and stuttered slightly. He saw Dr. Gerilyn Pilgrim in the recent past for gait instability/falls, however it does not appear that he has had a brain MRI. [name] reports that there has been a rapid decline in pt's ability to speak and swallow in the last 3 months with significant weight loss. She also reports that he was never hospitalized for swallow dysfunction/stroke. Pt with severe/profound dysarthria and dysphagia- query bulbar involvement. Recommendations discussed with Dr. Kerry Hough. Dr. Gerilyn Pilgrim has not seen the pt during this admission and his input may be insightful given the reported recent rapid decline in function. If pt does not want a feeding tube and opts for "safest diet" with the understanding that aspiration is known and likely, recommend puree with NTL. SLP will  follow while in acute setting for goals of care (per MD).     Treatment Recommendation  Therapy as outlined in treatment plan below    Diet Recommendation NPO;Alternative means - long-term         Other  Recommendations Oral Care Recommendations: Oral care Q4 per protocol   Follow Up Recommendations  Skilled Nursing facility    Frequency and Duration min 2x/week  2 weeks       SLP Swallow Goals  Follow for goal of care tomorrow   General Date of Onset: 01/02/14 HPI: Mr. [name] is a 58 y.o. year old male with significant past medical history of COPD, hypertension, CVA presenting with acute respiratory failure with hypoxia and hypercarbia. Level V caveat as patient is acutely in respiratory distress and is nonverbal at baseline. Per report, patient with worsening respiratory status over the past 24 hours at his skilled nursing facility. Was given Solu-Medrol Xopenex at the living facility per her report with minimal improvement in symptoms. Still smoking daily. Patient was noted to be satting in the mid 70s on 10 L. He is a resident at [facility]. Pt is currently NPO. SLP spoke with SLP at [facility] and picked up his computer to facilitate communication. He reportedly consumes puree diet with thin liquids, but always sounds wet/congested. Pt has severe/profound dysarthria negatively impacting expressive language skills. He uses a computer to communicate and writing on paper.  Type of Study: Modified Barium Swallowing Study Reason for Referral: Objectively evaluate swallowing function Previous Swallow Assessment: BSE yesterday Diet Prior to this Study: NPO Temperature Spikes Noted: No Respiratory Status: Nasal cannula History of Recent Intubation: No Behavior/Cognition: Alert;Cooperative;Pleasant mood;Requires cueing Oral Cavity - Dentition: Edentulous Oral Motor / Sensory Function: Impaired - see Bedside swallow eval Self-Feeding Abilities: Needs assist Patient Positioning: Upright in bed Baseline Vocal Quality: Wet Volitional Cough: Weak;Congested;Wet Volitional Swallow: Unable to elicit Anatomy: Within functional limits Pharyngeal Secretions: Not observed secondary MBS     Reason for Referral Objectively evaluate swallowing function   Oral Phase Oral Preparation/Oral Phase Oral Phase: Impaired Oral - Nectar Oral - Nectar Cup: Weak lingual manipulation Oral - Thin Oral - Thin Teaspoon: Weak lingual manipulation Oral - Solids Oral - Puree: Weak lingual manipulation   Pharyngeal Phase Pharyngeal Phase Pharyngeal Phase: Impaired Pharyngeal - Nectar Pharyngeal - Nectar Cup: Premature spillage to valleculae;Reduced pharyngeal peristalsis;Reduced epiglottic inversion;Reduced anterior laryngeal mobility;Reduced laryngeal elevation;Reduced airway/laryngeal closure;Reduced tongue base retraction;Penetration/Aspiration during swallow;Penetration/Aspiration after swallow;Trace aspiration;Moderate aspiration;Pharyngeal residue - valleculae;Pharyngeal residue - pyriform sinuses Penetration/Aspiration details (nectar cup): Material enters airway, passes BELOW cords and not ejected out despite cough attempt by patient;Material enters airway, passes BELOW cords without attempt by patient to eject out (silent aspiration);Material enters airway, passes BELOW cords then ejected out Pharyngeal - Thin Pharyngeal - Thin Teaspoon: Premature spillage to valleculae;Reduced epiglottic inversion;Reduced anterior laryngeal mobility;Reduced laryngeal elevation;Reduced airway/laryngeal closure;Reduced tongue base retraction;Penetration/Aspiration before swallow;Pharyngeal residue - pyriform sinuses;Pharyngeal residue - valleculae Penetration/Aspiration details (thin teaspoon): Material enters airway, remains ABOVE vocal cords and not ejected out;Material enters airway, remains ABOVE vocal cords then ejected out Pharyngeal - Thin Cup: Premature spillage to valleculae;Reduced pharyngeal peristalsis;Reduced epiglottic inversion;Reduced anterior laryngeal mobility;Reduced laryngeal elevation;Reduced airway/laryngeal closure;Reduced tongue base retraction;Penetration/Aspiration during  swallow;Penetration/Aspiration after swallow;Trace aspiration;Moderate aspiration;Pharyngeal residue - valleculae;Pharyngeal residue - pyriform sinuses;Lateral channel residue Penetration/Aspiration details (thin cup): Material enters airway, passes BELOW cords without attempt by patient to eject out (silent aspiration);Material enters airway, passes BELOW cords and not ejected out despite cough attempt by patient;Material enters  airway, passes BELOW cords then ejected out;Material enters airway, CONTACTS cords and not ejected out Pharyngeal - Thin Straw: Premature spillage to valleculae;Reduced pharyngeal peristalsis;Reduced epiglottic inversion;Reduced anterior laryngeal mobility;Reduced laryngeal elevation;Reduced airway/laryngeal closure;Reduced tongue base retraction;Penetration/Aspiration during swallow;Penetration/Aspiration after swallow;Trace aspiration;Moderate aspiration;Pharyngeal residue - valleculae;Pharyngeal residue - pyriform sinuses;Lateral channel residue Penetration/Aspiration details (thin straw): Material enters airway, passes BELOW cords without attempt by patient to eject out (silent aspiration);Material enters airway, passes BELOW cords and not ejected out despite cough attempt by patient;Material enters airway, passes BELOW cords then ejected out;Material enters airway, CONTACTS cords and not ejected out Pharyngeal - Solids Pharyngeal - Puree: Delayed swallow initiation;Premature spillage to valleculae;Reduced epiglottic inversion;Reduced anterior laryngeal mobility;Reduced laryngeal elevation;Reduced pharyngeal peristalsis;Penetration/Aspiration after swallow;Pharyngeal residue - valleculae;Pharyngeal residue - pyriform sinuses Penetration/Aspiration details (puree): Material enters airway, remains ABOVE vocal cords and not ejected out  Cervical Esophageal Phase       Cervical Esophageal Phase Cervical Esophageal Phase: Impaired Cervical Esophageal Phase - Solids Puree:  Reduced cricopharyngeal relaxation        Thank you,  Havery Moros, CCC-SLP [phone]  Wilburt Messina 01/04/2014, 3:07 PM

## 2014-01-05 DIAGNOSIS — I69991 Dysphagia following unspecified cerebrovascular disease: Secondary | ICD-10-CM

## 2014-01-05 DIAGNOSIS — R1310 Dysphagia, unspecified: Secondary | ICD-10-CM | POA: Diagnosis present

## 2014-01-05 DIAGNOSIS — T17928A Food in respiratory tract, part unspecified causing other injury, initial encounter: Secondary | ICD-10-CM | POA: Diagnosis present

## 2014-01-05 DIAGNOSIS — T17920A Food in respiratory tract, part unspecified causing asphyxiation, initial encounter: Secondary | ICD-10-CM | POA: Diagnosis present

## 2014-01-05 LAB — COMPREHENSIVE METABOLIC PANEL
ALK PHOS: 56 U/L (ref 39–117)
ALT: 19 U/L (ref 0–53)
AST: 16 U/L (ref 0–37)
Albumin: 3.4 g/dL — ABNORMAL LOW (ref 3.5–5.2)
Anion gap: 7 (ref 5–15)
BUN: 28 mg/dL — ABNORMAL HIGH (ref 6–23)
CO2: 31 mEq/L (ref 19–32)
Calcium: 8.7 mg/dL (ref 8.4–10.5)
Chloride: 103 mEq/L (ref 96–112)
Creatinine, Ser: 0.71 mg/dL (ref 0.50–1.35)
GFR calc non Af Amer: >90 mL/min (ref >90)
Glucose, Bld: 129 mg/dL — ABNORMAL HIGH (ref 70–99)
POTASSIUM: 5 meq/L (ref 3.7–5.3)
SODIUM: 141 meq/L (ref 137–147)
TOTAL PROTEIN: 6.1 g/dL (ref 6.0–8.3)
Total Bilirubin: 0.2 mg/dL — ABNORMAL LOW (ref 0.3–1.2)

## 2014-01-05 LAB — CBC WITH DIFFERENTIAL/PLATELET
BASOS PCT: 0 % (ref 0–1)
Basophils Absolute: 0 10*3/uL (ref 0.0–0.1)
EOS ABS: 0 10*3/uL (ref 0.0–0.7)
EOS PCT: 0 % (ref 0–5)
HEMATOCRIT: 39.7 % (ref 39.0–52.0)
HEMOGLOBIN: 13 g/dL (ref 13.0–17.0)
Lymphocytes Relative: 11 % — ABNORMAL LOW (ref 12–46)
Lymphs Abs: 0.6 10*3/uL — ABNORMAL LOW (ref 0.7–4.0)
MCH: 29.7 pg (ref 26.0–34.0)
MCHC: 32.7 g/dL (ref 30.0–36.0)
MCV: 90.6 fL (ref 78.0–100.0)
MONO ABS: 0.2 10*3/uL (ref 0.1–1.0)
MONOS PCT: 3 % (ref 3–12)
Neutro Abs: 4.6 10*3/uL (ref 1.7–7.7)
Neutrophils Relative %: 86 % — ABNORMAL HIGH (ref 43–77)
Platelets: 122 10*3/uL — ABNORMAL LOW (ref 150–400)
RBC: 4.38 MIL/uL (ref 4.22–5.81)
RDW: 13.4 % (ref 11.5–15.5)
WBC: 5.3 10*3/uL (ref 4.0–10.5)

## 2014-01-05 LAB — RPR

## 2014-01-05 LAB — HOMOCYSTEINE: Homocysteine: 7 umol/L (ref 4.0–15.4)

## 2014-01-05 LAB — CK TOTAL AND CKMB (NOT AT ARMC)
CK TOTAL: 107 U/L (ref 7–232)
CK, MB: 5.3 ng/mL — AB (ref 0.3–4.0)
Relative Index: 5 — ABNORMAL HIGH (ref 0.0–2.5)

## 2014-01-05 LAB — TSH: TSH: 1.06 u[IU]/mL (ref 0.350–4.500)

## 2014-01-05 LAB — GLUCOSE, CAPILLARY
GLUCOSE-CAPILLARY: 108 mg/dL — AB (ref 70–99)
GLUCOSE-CAPILLARY: 139 mg/dL — AB (ref 70–99)
Glucose-Capillary: 111 mg/dL — ABNORMAL HIGH (ref 70–99)

## 2014-01-05 LAB — VITAMIN B12: Vitamin B-12: 433 pg/mL (ref 211–911)

## 2014-01-05 LAB — HIV ANTIBODY (ROUTINE TESTING W REFLEX): HIV 1&2 Ab, 4th Generation: NONREACTIVE

## 2014-01-05 MED ORDER — METHYLPREDNISOLONE SODIUM SUCC 125 MG IJ SOLR
80.0000 mg | Freq: Two times a day (BID) | INTRAMUSCULAR | Status: DC
  Administered 2014-01-06: 80 mg via INTRAVENOUS
  Filled 2014-01-05: qty 2

## 2014-01-05 NOTE — Progress Notes (Signed)
Patient transferred to Dept. 300 taken via bed. Report called.

## 2014-01-05 NOTE — Progress Notes (Signed)
[facility] PROGRESS NOTE  TRUONG DELCASTILLO WJX:[id number] DOB: 09/07/1955 DOA: 01/02/2014 PCP: Pearson Grippe, MD  Assessment/Plan: 1. Acute respiratory failure with hypoxia and hypercarbia. Admitted with shortness of breath requiring BiPAP. Felt to be related to COPD exacerbation. Clinically appears to be improving. Now off BiPAP and on nasal cannula. We will try to titrate oxygen down as tolerated.  2. Dysphagia. Seen by speech therapy and underwent modified barium swallow study. Results of the study due to the patient was unable to tolerate any form of food/liquid without aspiration. Options of feeding tube were discussed with patient and he adamantly refused any artificial nutrition (he reported he would pull out any tubes are placed to feed him). Instead, he wishes to accept the risk of aspiration to continue by mouth intake. We'll change to pured diet with nectar thick liquids in order to minimize risk, although he is likely even aspirating on his secretions. 3. COPD exacerbation. Currently on steroids, antibiotics and bronchodilators. No wheezing appreciated on exam. Will start to be escalated steroids. 4. Hypertensive urgency. Appears to have improved. Continue current treatment 5. Prior history of stroke with resultant hemiparesis and muteness. Patient is essentially wheelchair bound at the nursing home. He communicates via typing into computer. 6. Progressive weakness in upper and lower extremities. Patient is essentially wheelchair-bound. He has had prior strokes in the past. Neurology was consulted and felt that with his progressive weakness, upper motor neuron findings involving the legs and lower motor neuron findings in both upper extremities, there is concern that patient may have an underlying motor neuron disease/ALS. CPK and acetylcholine receptor antibodies have been sent at this time. For further diagnosis, he would need dedicated and extensive nerve conduction studies and needle  electromyographic which can only be done as an outpatient.  Code Status: DNR. Discussed with patient Family Communication: Discussed with the patient's guardian, Radford Pax. I explained the nature of patient's underlying conditions, his poor quality of life and his significant dysphagia with aspiration. I recommended DO NOT RESUSCITATE status which she agrees with. I also recommended consideration for hospice services since his condition is likely going to progress as it has over the past 6 months. Will discuss this tomorrow with patient. Disposition Plan: discharge back to [facility] when stable   Consultants:  Neurology  Procedures:    Antibiotics:  Levofloxacin 9/22  HPI/Subjective: Patient is nonverbal at baseline from prior stroke.  He communicates by typing on his computer. He is still coughing up sputum.  Discussed swallow evaluation and possible need for PEG tube. Patient adamantly refused any feeding tubes. He wishes to continue eating and accept risks of aspiration.   Objective: Filed Vitals:   01/05/14 1321  BP: 128/81  Pulse: 79  Temp: 98.7 F (37.1 C)  Resp: 18    Intake/Output Summary (Last 24 hours) at 01/05/14 1808 Last data filed at 01/05/14 1800  Gross per 24 hour  Intake   2355 ml  Output    375 ml  Net   1980 ml   Filed Weights   01/03/14 0300 01/04/14 0448 01/05/14 0500  Weight: 57 kg (125 lb 10.6 oz) 57.8 kg (127 lb 6.8 oz) 58.1 kg (128 lb 1.4 oz)    Exam:   General:  NAD  Cardiovascular: S1, S2 RRR  Respiratory: bilateral rhonchi, no wheezing  Abdomen: soft, nt, nd, bs+  Musculoskeletal: no edema b/l   Data Reviewed: Basic Metabolic Panel:  Recent Labs Lab 01/02/14 2337 01/03/14 0427 01/04/14 0820 01/05/14 0500  NA 146 148* 145 141  K 4.4 4.4 4.7 5.0  CL 101 104 105 103  CO2 34* 32 32 31  GLUCOSE 232* 186* 98 129*  BUN 25* 28* 30* 28*  CREATININE 0.78 0.83 0.79 0.71  CALCIUM 9.6 8.8 8.9 8.7   Liver Function  Tests:  Recent Labs Lab 01/03/14 0427 01/04/14 0820 01/05/14 0500  AST ALT ALKPHOS 68 54 56  BILITOT <0.2* 0.2* 0.2*  PROT 6.3 5.8* 6.1  ALBUMIN 3.7 3.3* 3.4*   No results found for this basename: LIPASE, AMYLASE,  in the last 168 hours No results found for this basename: AMMONIA,  in the last 168 hours CBC:  Recent Labs Lab 01/02/14 2337 01/03/14 0427 01/04/14 0820 01/05/14 0500  WBC 8.8 12.6* 8.4 5.3  NEUTROABS 7.8* 11.7* 6.2 4.6  HGB 16.3 14.1 12.1* 13.0  HCT 49.8 43.5 37.7* 39.7  MCV 92.7 92.4 92.2 90.6  PLT 170 141* 120* 122*   Cardiac Enzymes:  Recent Labs Lab 01/02/14 2337 01/03/14 0205 01/03/14 0809 01/03/14 1408 01/05/14 0500  CKTOTAL  --   --   --   --  107  CKMB  --   --   --   --  5.3*  TROPONINI <0.30 <0.30 <0.30 <0.30  --    BNP (last 3 results)  Recent Labs  01/02/14 2337  PROBNP 231.3*   CBG:  Recent Labs Lab 01/04/14 1744 01/04/14 2134 01/05/14 0743 01/05/14 1127 01/05/14 1746  GLUCAP 119* 144* 111* 108* 139*    Recent Results (from the past 240 hour(s))  CULTURE, BLOOD (ROUTINE X 2)     Status: None   Collection Time    01/03/14 12:16 AM      Result Value Ref Range Status   Specimen Description BLOOD LEFT FOREARM   Final   Special Requests     Final   Value: BOTTLES DRAWN AEROBIC AND ANAEROBIC AEB=8CC ANA=4CC   Culture NO GROWTH 2 DAYS   Final   Report Status PENDING   Incomplete  CULTURE, BLOOD (ROUTINE X 2)     Status: None   Collection Time    01/03/14 12:16 AM      Result Value Ref Range Status   Specimen Description BLOOD LEFT HAND   Final   Special Requests     Final   Value: BOTTLES DRAWN AEROBIC AND ANAEROBIC AEB=8CC ANA=6CC   Culture NO GROWTH 2 DAYS   Final   Report Status PENDING   Incomplete  URINE CULTURE     Status: None   Collection Time    01/03/14  3:00 AM      Result Value Ref Range Status   Specimen Description URINE, CLEAN CATCH   Final   Special Requests NONE   Final    Culture  Setup Time     Final   Value: 01/03/2014 10:30     Performed at [facility] Count     Final   Value: NO GROWTH     Performed at [facility]   Culture     Final   Value: NO GROWTH     Performed at [facility]   Report Status 01/04/2014 FINAL   Final  MRSA PCR SCREENING     Status: None   Collection Time    01/03/14  3:00 AM      Result Value Ref Range Status   MRSA by PCR NEGATIVE  NEGATIVE Final   Comment:            The GeneXpert MRSA Assay (FDA     approved for NASAL specimens     only), is one component of a     comprehensive MRSA colonization     surveillance program. It is not     intended to diagnose MRSA     infection nor to guide or     monitor treatment for     MRSA infections.     Studies: Dg Swallowing Func-speech Pathology  01/04/2014   Dorene Ar, CCC-SLP     01/04/2014  3:08 PM Objective Swallowing Evaluation: Modified Barium Swallowing Study   Patient Details  Name: TAJAI IHDE MRN: [id number] Date of Birth: [birth year]  Today's Date: 01/04/2014 Time: 1330-1400 SLP Time Calculation (min): 30 min  Past Medical History:  Past Medical History  Diagnosis Date  . Back pain   . Dementia   . Hyperlipidemia   . Gait disturbance   . Depressive disorder   . COPD (chronic obstructive pulmonary disease)   . Dysphagia   . CVA (cerebral infarction)   . Stroke   . Hypertension    Past Surgical History:  Past Surgical History  Procedure Laterality Date  . Cholecystectomy     HPI:  Mr. Athan Casalino is a 58 y.o. year old male with significant past  medical history of COPD, hypertension, CVA presenting with acute  respiratory failure with hypoxia and hypercarbia. Level V caveat  as patient is acutely in respiratory distress and is nonverbal at  baseline. Per report, patient with worsening respiratory status  over the past 24 hours at his skilled nursing facility. Was given  Solu-Medrol Xopenex at the living facility per her report with   minimal improvement in symptoms. Still smoking daily. Patient was  noted to be satting in the mid 70s on 10 L. He is a resident at  [facility]. Pt is currently NPO. SLP spoke with SLP at [facility] and  picked up his computer to facilitate communication. He reportedly  consumes puree diet with thin liquids, but always sounds  wet/congested. Pt has severe/profound dysarthria negatively  impacting expressive language skills. He uses a computer to  communicate and writing on paper.      Assessment / Plan / Recommendation Clinical Impression  Dysphagia Diagnosis: Severe pharyngeal phase dysphagia Clinical impression: Mr. Bolar presents with severe pharyngeal  phase dysphagia characterized by premature spillage to the  valleculae, decreased tongue base retraction, epiglottic  deflection, hyolaryngeal excursion, and CP relaxation resulting  in overall decreased pharyngeal pressure with stasis of bolus in  valleculae and pyriforms after the swallow and inability to  clear. Pt with severe difficulty passing puree through UES. He  was more successful in swallowing thin and nectars (through UES)  via sequential swallows, however he was also more likely to  aspirate the liquids. ~50% of the liquid bolus passed through UES  while the remaining was retained in pharynx eventually leading to  penetration and aspiration. Pt was made efforts to cough to clear  aspirates, however was unsuccessful due to poor respiratory  effort. Pt is judged to be at high risk for aspiration of all  consistencies and textures with the majority of the bolus left in  pharynx and pt unable to clear. Recommend NPO with alternative  source of nutrition, long term. SLP called and spoke to his  friend, [name] (former fiance/girlfriend) who reports that pt  has resided at [facility]  for ~6 months. She reports that he has been  quick to cry (labile) for years and stuttered slightly. He saw  Dr. Gerilyn Pilgrim in the recent past for gait instability/falls,  however it does  not appear that he has had a brain MRI. [name]  reports that there has been a rapid decline in pt's ability to  speak and swallow in the last 3 months with significant weight  loss. She also reports that he was never hospitalized for swallow  dysfunction/stroke. Pt with severe/profound dysarthria and  dysphagia- query bulbar involvement. Recommendations discussed  with Dr. Kerry Hough. Dr. Gerilyn Pilgrim has not seen the pt during this  admission and his input may be insightful given the reported  recent rapid decline in function. If pt does not want a feeding  tube and opts for "safest diet" with the understanding that  aspiration is known and likely, recommend puree with NTL. SLP  will follow while in acute setting for goals of care (per MD).     Treatment Recommendation  Therapy as outlined in treatment plan below    Diet Recommendation NPO;Alternative means - long-term        Other  Recommendations Oral Care Recommendations: Oral care Q4  per protocol   Follow Up Recommendations  Skilled Nursing facility    Frequency and Duration min 2x/week  2 weeks       SLP Swallow Goals  Follow for goal of care tomorrow   General Date of Onset: 01/02/14 HPI: Mr. Richmond Coldren is a 58 y.o. year old male with significant  past medical history of COPD, hypertension, CVA presenting with  acute respiratory failure with hypoxia and hypercarbia. Level V  caveat as patient is acutely in respiratory distress and is  nonverbal at baseline. Per report, patient with worsening  respiratory status over the past 24 hours at his skilled nursing  facility. Was given Solu-Medrol Xopenex at the living facility  per her report with minimal improvement in symptoms. Still  smoking daily. Patient was noted to be satting in the mid 70s on  10 L. He is a resident at [facility]. Pt is currently NPO. SLP spoke  with SLP at [facility] and picked up his computer to facilitate  communication. He reportedly consumes puree diet with thin  liquids, but always sounds  wet/congested. Pt has severe/profound  dysarthria negatively impacting expressive language skills. He  uses a computer to communicate and writing on paper.  Type of Study: Modified Barium Swallowing Study Reason for Referral: Objectively evaluate swallowing function Previous Swallow Assessment: BSE yesterday Diet Prior to this Study: NPO Temperature Spikes Noted: No Respiratory Status: Nasal cannula History of Recent Intubation: No Behavior/Cognition: Alert;Cooperative;Pleasant mood;Requires  cueing Oral Cavity - Dentition: Edentulous Oral Motor / Sensory Function: Impaired - see Bedside swallow  eval Self-Feeding Abilities: Needs assist Patient Positioning: Upright in bed Baseline Vocal Quality: Wet Volitional Cough: Weak;Congested;Wet Volitional Swallow: Unable to elicit Anatomy: Within functional limits Pharyngeal Secretions: Not observed secondary MBS    Reason for Referral Objectively evaluate swallowing function   Oral Phase Oral Preparation/Oral Phase Oral Phase: Impaired Oral - Nectar Oral - Nectar Cup: Weak lingual manipulation Oral - Thin Oral - Thin Teaspoon: Weak lingual manipulation Oral - Solids Oral - Puree: Weak lingual manipulation   Pharyngeal Phase Pharyngeal Phase Pharyngeal Phase: Impaired Pharyngeal - Nectar Pharyngeal - Nectar Cup: Premature spillage to valleculae;Reduced  pharyngeal peristalsis;Reduced epiglottic inversion;Reduced  anterior laryngeal mobility;Reduced laryngeal elevation;Reduced  airway/laryngeal closure;Reduced tongue base  retraction;Penetration/Aspiration during  swallow;Penetration/Aspiration after  swallow;Trace  aspiration;Moderate aspiration;Pharyngeal residue -  valleculae;Pharyngeal residue - pyriform sinuses Penetration/Aspiration details (nectar cup): Material enters  airway, passes BELOW cords and not ejected out despite cough  attempt by patient;Material enters airway, passes BELOW cords  without attempt by patient to eject out (silent  aspiration);Material enters  airway, passes BELOW cords then  ejected out Pharyngeal - Thin Pharyngeal - Thin Teaspoon: Premature spillage to  valleculae;Reduced epiglottic inversion;Reduced anterior  laryngeal mobility;Reduced laryngeal elevation;Reduced  airway/laryngeal closure;Reduced tongue base  retraction;Penetration/Aspiration before swallow;Pharyngeal  residue - pyriform sinuses;Pharyngeal residue - valleculae Penetration/Aspiration details (thin teaspoon): Material enters  airway, remains ABOVE vocal cords and not ejected out;Material  enters airway, remains ABOVE vocal cords then ejected out Pharyngeal - Thin Cup: Premature spillage to valleculae;Reduced  pharyngeal peristalsis;Reduced epiglottic inversion;Reduced  anterior laryngeal mobility;Reduced laryngeal elevation;Reduced  airway/laryngeal closure;Reduced tongue base  retraction;Penetration/Aspiration during  swallow;Penetration/Aspiration after swallow;Trace  aspiration;Moderate aspiration;Pharyngeal residue -  valleculae;Pharyngeal residue - pyriform sinuses;Lateral channel  residue Penetration/Aspiration details (thin cup): Material enters  airway, passes BELOW cords without attempt by patient to eject  out (silent aspiration);Material enters airway, passes BELOW  cords and not ejected out despite cough attempt by  patient;Material enters airway, passes BELOW cords then ejected  out;Material enters airway, CONTACTS cords and not ejected out Pharyngeal - Thin Straw: Premature spillage to valleculae;Reduced  pharyngeal peristalsis;Reduced epiglottic inversion;Reduced  anterior laryngeal mobility;Reduced laryngeal elevation;Reduced  airway/laryngeal closure;Reduced tongue base  retraction;Penetration/Aspiration during  swallow;Penetration/Aspiration after swallow;Trace  aspiration;Moderate aspiration;Pharyngeal residue -  valleculae;Pharyngeal residue - pyriform sinuses;Lateral channel  residue Penetration/Aspiration details (thin straw): Material enters  airway, passes BELOW  cords without attempt by patient to eject  out (silent aspiration);Material enters airway, passes BELOW  cords and not ejected out despite cough attempt by  patient;Material enters airway, passes BELOW cords then ejected  out;Material enters airway, CONTACTS cords and not ejected out Pharyngeal - Solids Pharyngeal - Puree: Delayed swallow initiation;Premature spillage  to valleculae;Reduced epiglottic inversion;Reduced anterior  laryngeal mobility;Reduced laryngeal elevation;Reduced pharyngeal  peristalsis;Penetration/Aspiration after swallow;Pharyngeal  residue - valleculae;Pharyngeal residue - pyriform sinuses Penetration/Aspiration details (puree): Material enters airway,  remains ABOVE vocal cords and not ejected out  Cervical Esophageal Phase       Cervical Esophageal Phase Cervical Esophageal Phase: Impaired Cervical Esophageal Phase - Solids Puree: Reduced cricopharyngeal relaxation        Thank you,  Havery Moros, CCC-SLP [phone]  PORTER,DABNEY 01/04/2014, 3:07 PM     Scheduled Meds: . famotidine (PEPCID) IV  20 mg Intravenous Q24H  . guaiFENesin  1,200 mg Oral BID  . heparin  5,000 Units Subcutaneous 3 times per day  . insulin aspart  0-20 Units Subcutaneous TID WC  . insulin aspart  0-5 Units Subcutaneous QHS  . ipratropium-albuterol  3 mL Nebulization Q4H  . levofloxacin (LEVAQUIN) IV  750 mg Intravenous Q24H  . [START ON 01/06/2014] methylPREDNISolone (SOLU-MEDROL) injection  80 mg Intravenous Q12H  . sodium chloride  3 mL Intravenous Q12H   Continuous Infusions: . 0.45 % NaCl with KCl 20 mEq / L 75 mL/hr at 01/05/14 0981    Active Problems:   Acute respiratory failure with hypoxia and hypercarbia   Thrombocytopenia, unspecified   Hypernatremia   Hyperglycemia   History of CVA (cerebrovascular accident)    Time spent:   [facility]  [facility] Pager [phone]. If 7PM-7AM, please contact night-coverage at [email or web address], password [facility] 01/05/2014, 6:08 PM   LOS: 3 days

## 2014-01-05 NOTE — Progress Notes (Signed)
Patient ID: Jason Cisneros, male   DOB: Jan 07, 1956, 58 y.o.   MRN: [id number]  Jason A. Merlene Laughter, MD     [email or web address]          Jason Cisneros is an 58 y.o. male.   Assessment/Plan: 1. Chronic progressive gait impairment. Now associated with dysphagia. The combination of upper motor neuron findings involving the legs and lower motor neuron findings in both upper extremities is very worrisome for motor neuron disease/ALS. ALS could also did explain the patient's dysphagia and the dyspnea/elevated PCO2. To diagnose this the patient will have to have a dedicated and extensive nerve conduction study and needle electromyography. Unfortunate, this is not available inpatient. This could be arranged on discharge. Limited neuromuscular evaluation will be done including CPK and acetylcholine receptor antibodies.  2. Long-standing history of dysarthria which developed acutely in 1995. This is thought to be related to lacunar infarcts. Continue with aspirin antiplatelet agents, lipid medication and blood pressure control. He has had multiple repeat imaging in the past but is reasonable to repeat an MRI of the brain.  3. 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    No complaints. On Room air. Working on computer  GENERAL: Thin in no acute distress.  HEENT: Supple. Atraumatic normocephalic.  ABDOMEN: soft  EXTREMITIES: No edema  BACK: Normal.  SKIN: Normal by inspection.  MENTAL STATUS: Profound dysarthria which makes speech totally incomprehendible. Follows commands briskly.  CRANIAL NERVES: Pupils are equal, round and reactive to light and accommodation; extra ocular movements are full, there is no significant nystagmus; visual fields are full; upper and lower facial muscles are normal in strength and symmetric, there is no flattening of the nasolabial folds; tongue is midline; uvula is midline; shoulder elevation is normal.    MOTOR: Deltoids are 3/5 bilaterally which is worse compared to my initial exam on the patient July 2014. At that time it was 4/5. The patient clearly has marked atrophy of the intrinsic hand muscles bilaterally. There is constant fasciculations noted of the first dorsal interossei muscles bilaterally. Handgrip is 2/5. There is markedly increased tone again involving the legs although strength is 5/5. There is some quivering of the tongue but no sense of clear fasciculations.  COORDINATION: Left finger to nose is normal, right finger to nose is normal, No rest tremor; no intention tremor; no postural tremor; no bradykinesia.  REFLEXES: Deep tendon reflexes are symmetrical and normal In the upper extremities but clearly pathologically brisk 3+ in the legs. Babinski reflexes are Extensor bilaterally.  SENSATION: Normal to light pain.        Objective: Vital signs in last 24 hours: Temp:  [98.3 F (36.8 C)-99.3 F (37.4 C)] 98.7 F (37.1 C) (09/24 1321) Pulse Rate:  [51-87] 79 (09/24 1321) Resp:  [11-37] 18 (09/24 1321) BP: (103-171)/(54-95) 128/81 mmHg (09/24 1321) SpO2:  [92 %-100 %] 92 % (09/24 1526) FiO2 (%):  [40 %] 40 % (09/23 2238) Weight:  [58.1 kg (128 lb 1.4 oz)] 58.1 kg (128 lb 1.4 oz) (09/24 0500)  Intake/Output from previous day: 09/23 0701 - 09/24 0700 In: 2480 [I.V.:2280; IV Piggyback:200] Out: 375 [Urine:375] Intake/Output this shift: Total I/O In: 390 [P.O.:240; I.V.:150] Out: -  Nutritional status: Dysphagia   Lab Results: Results for orders placed during the hospital encounter of 01/02/14 (from the past 48 hour(s))  GLUCOSE, CAPILLARY     Status: Abnormal   Collection Time    01/03/14  9:04  PM      Result Value Ref Range   Glucose-Capillary 124 (*) 70 - 99 mg/dL  GLUCOSE, CAPILLARY     Status: Abnormal   Collection Time    01/04/14  7:41 AM      Result Value Ref Range   Glucose-Capillary 104 (*) 70 - 99 mg/dL  COMPREHENSIVE METABOLIC PANEL     Status:  Abnormal   Collection Time    01/04/14  8:20 AM      Result Value Ref Range   Sodium 145  137 - 147 mEq/L   Potassium 4.7  3.7 - 5.3 mEq/L   Chloride 105  96 - 112 mEq/L   CO2 32  19 - 32 mEq/L   Glucose, Bld 98  70 - 99 mg/dL   BUN 30 (*) 6 - 23 mg/dL   Creatinine, Ser 0.79  0.50 - 1.35 mg/dL   Calcium 8.9  8.4 - 10.5 mg/dL   Total Protein 5.8 (*) 6.0 - 8.3 g/dL   Albumin 3.3 (*) 3.5 - 5.2 g/dL   AST 16  0 - 37 U/L   ALT 21  0 - 53 U/L   Alkaline Phosphatase 54  39 - 117 U/L   Total Bilirubin 0.2 (*) 0.3 - 1.2 mg/dL   GFR calc non Af Amer >90  >90 mL/min   GFR calc Af Amer >90  >90 mL/min   Comment: (NOTE)     The eGFR has been calculated using the CKD EPI equation.     This calculation has not been validated in all clinical situations.     eGFR's persistently <90 mL/min signify possible Chronic Kidney     Disease.   Anion gap 8  5 - 15  CBC WITH DIFFERENTIAL     Status: Abnormal   Collection Time    01/04/14  8:20 AM      Result Value Ref Range   WBC 8.4  4.0 - 10.5 K/uL   RBC 4.09 (*) 4.22 - 5.81 MIL/uL   Hemoglobin 12.1 (*) 13.0 - 17.0 g/dL   HCT 37.7 (*) 39.0 - 52.0 %   MCV 92.2  78.0 - 100.0 fL   MCH 29.6  26.0 - 34.0 pg   MCHC 32.1  30.0 - 36.0 g/dL   RDW 13.7  11.5 - 15.5 %   Platelets 120 (*) 150 - 400 K/uL   Neutrophils Relative % 74  43 - 77 %   Neutro Abs 6.2  1.7 - 7.7 K/uL   Lymphocytes Relative 14  12 - 46 %   Lymphs Abs 1.2  0.7 - 4.0 K/uL   Monocytes Relative 12  3 - 12 %   Monocytes Absolute 1.0  0.1 - 1.0 K/uL   Eosinophils Relative 0  0 - 5 %   Eosinophils Absolute 0.0  0.0 - 0.7 K/uL   Basophils Relative 0  0 - 1 %   Basophils Absolute 0.0  0.0 - 0.1 K/uL  GLUCOSE, CAPILLARY     Status: Abnormal   Collection Time    01/04/14 11:44 AM      Result Value Ref Range   Glucose-Capillary 100 (*) 70 - 99 mg/dL  GLUCOSE, CAPILLARY     Status: Abnormal   Collection Time    01/04/14  4:37 PM      Result Value Ref Range   Glucose-Capillary 120 (*) 70  - 99 mg/dL  GLUCOSE, CAPILLARY     Status: Abnormal   Collection Time  01/04/14  5:44 PM      Result Value Ref Range   Glucose-Capillary 119 (*) 70 - 99 mg/dL  GLUCOSE, CAPILLARY     Status: Abnormal   Collection Time    01/04/14  9:34 PM      Result Value Ref Range   Glucose-Capillary 144 (*) 70 - 99 mg/dL   Comment 1 Notify RN     Comment 2 Documented in Chart    COMPREHENSIVE METABOLIC PANEL     Status: Abnormal   Collection Time    01/05/14  5:00 AM      Result Value Ref Range   Sodium 141  137 - 147 mEq/L   Potassium 5.0  3.7 - 5.3 mEq/L   Chloride 103  96 - 112 mEq/L   CO2 31  19 - 32 mEq/L   Glucose, Bld 129 (*) 70 - 99 mg/dL   BUN 28 (*) 6 - 23 mg/dL   Creatinine, Ser 0.71  0.50 - 1.35 mg/dL   Calcium 8.7  8.4 - 10.5 mg/dL   Total Protein 6.1  6.0 - 8.3 g/dL   Albumin 3.4 (*) 3.5 - 5.2 g/dL   AST 16  0 - 37 U/L   ALT 19  0 - 53 U/L   Alkaline Phosphatase 56  39 - 117 U/L   Total Bilirubin 0.2 (*) 0.3 - 1.2 mg/dL   GFR calc non Af Amer >90  >90 mL/min   GFR calc Af Amer >90  >90 mL/min   Comment: (NOTE)     The eGFR has been calculated using the CKD EPI equation.     This calculation has not been validated in all clinical situations.     eGFR's persistently <90 mL/min signify possible Chronic Kidney     Disease.   Anion gap 7  5 - 15  CBC WITH DIFFERENTIAL     Status: Abnormal   Collection Time    01/05/14  5:00 AM      Result Value Ref Range   WBC 5.3  4.0 - 10.5 K/uL   RBC 4.38  4.22 - 5.81 MIL/uL   Hemoglobin 13.0  13.0 - 17.0 g/dL   HCT 39.7  39.0 - 52.0 %   MCV 90.6  78.0 - 100.0 fL   MCH 29.7  26.0 - 34.0 pg   MCHC 32.7  30.0 - 36.0 g/dL   RDW 13.4  11.5 - 15.5 %   Platelets 122 (*) 150 - 400 K/uL   Neutrophils Relative % 86 (*) 43 - 77 %   Neutro Abs 4.6  1.7 - 7.7 K/uL   Lymphocytes Relative 11 (*) 12 - 46 %   Lymphs Abs 0.6 (*) 0.7 - 4.0 K/uL   Monocytes Relative 3  3 - 12 %   Monocytes Absolute 0.2  0.1 - 1.0 K/uL   Eosinophils Relative 0   0 - 5 %   Eosinophils Absolute 0.0  0.0 - 0.7 K/uL   Basophils Relative 0  0 - 1 %   Basophils Absolute 0.0  0.0 - 0.1 K/uL  VITAMIN B12     Status: None   Collection Time    01/05/14  5:00 AM      Result Value Ref Range   Vitamin B-12 433  211 - 911 pg/mL   Comment: Performed at [facility]  RPR     Status: None   Collection Time    01/05/14  5:00 AM  Result Value Ref Range   RPR NON REAC  NON REAC   Comment: Performed at [facility]     Status: None   Collection Time    01/05/14  5:00 AM      Result Value Ref Range   Homocysteine 7.0  4.0 - 15.4 umol/L   Comment: Performed at [facility]  TSH     Status: None   Collection Time    01/05/14  5:00 AM      Result Value Ref Range   TSH 1.060  0.350 - 4.500 uIU/mL   Comment: Performed at [place] [facility]     Status: Abnormal   Collection Time    01/05/14  5:00 AM      Result Value Ref Range   Total CK 107  7 - 232 U/L   CK, MB 5.3 (*) 0.3 - 4.0 ng/mL   Relative Index 5.0 (*) 0.0 - 2.5   Comment: Performed at [facility]  HIV ANTIBODY (ROUTINE TESTING)     Status: None   Collection Time    01/05/14  5:00 AM      Result Value Ref Range   HIV 1&2 Ab, 4th Generation NONREACTIVE  NONREACTIVE   Comment: (NOTE)     A NONREACTIVE HIV Ag/Ab result does not exclude HIV infection since     the time frame for seroconversion is variable. If acute HIV infection     is suspected, a HIV-1 RNA Qualitative TMA test is recommended.     HIV-1/2 Antibody Diff         Not indicated.     HIV-1 RNA, Qual TMA           Not indicated.     PLEASE NOTE: This information has been disclosed to you from records     whose confidentiality may be protected by state law. If your state     requires such protection, then the state law prohibits you from making     any further disclosure of the information without the specific written     consent of the person to whom it pertains, or  as otherwise permitted     by law. A general authorization for the release of medical or other     information is NOT sufficient for this purpose.     The performance of this assay has not been clinically validated in     patients less than 50 years old.     Performed at [place], [facility]     Status: Abnormal   Collection Time    01/05/14  7:43 AM      Result Value Ref Range   Glucose-Capillary 111 (*) 70 - 99 mg/dL  GLUCOSE, CAPILLARY     Status: Abnormal   Collection Time    01/05/14 11:27 AM      Result Value Ref Range   Glucose-Capillary 108 (*) 70 - 99 mg/dL    Lipid Panel No results found for this basename: CHOL, TRIG, HDL, CHOLHDL, VLDL, LDLCALC,  in the last 72 hours  Studies/Results:   Medications:  Scheduled Meds: . famotidine (PEPCID) IV  20 mg Intravenous Q24H  . guaiFENesin  1,200 mg Oral BID  . heparin  5,000 Units Subcutaneous 3 times per day  . insulin aspart  0-20 Units Subcutaneous TID WC  . insulin aspart  0-5 Units Subcutaneous QHS  . ipratropium-albuterol  3 mL  Nebulization Q4H  . levofloxacin (LEVAQUIN) IV  750 mg Intravenous Q24H  . methylPREDNISolone (SOLU-MEDROL) injection  80 mg Intravenous Q6H  . sodium chloride  3 mL Intravenous Q12H   Continuous Infusions: . 0.45 % NaCl with KCl 20 mEq / L 75 mL/hr at 01/05/14 0831   PRN Meds:.hydrALAZINE, ipratropium-albuterol, LORazepam     LOS: 3 days   Coriann Brouhard A. Merlene Cisneros, M.D.  Diplomate, Tax adviser of Psychiatry and Neurology ( Neurology).

## 2014-01-06 DIAGNOSIS — Z5189 Encounter for other specified aftercare: Secondary | ICD-10-CM

## 2014-01-06 DIAGNOSIS — R1310 Dysphagia, unspecified: Secondary | ICD-10-CM

## 2014-01-06 LAB — COMPREHENSIVE METABOLIC PANEL
ALBUMIN: 3.1 g/dL — AB (ref 3.5–5.2)
ALT: 26 U/L (ref 0–53)
AST: 22 U/L (ref 0–37)
Alkaline Phosphatase: 50 U/L (ref 39–117)
Anion gap: 9 (ref 5–15)
BILIRUBIN TOTAL: 0.3 mg/dL (ref 0.3–1.2)
BUN: 26 mg/dL — ABNORMAL HIGH (ref 6–23)
CO2: 29 meq/L (ref 19–32)
Calcium: 8.7 mg/dL (ref 8.4–10.5)
Chloride: 104 mEq/L (ref 96–112)
Creatinine, Ser: 0.67 mg/dL (ref 0.50–1.35)
GFR calc Af Amer: >90 mL/min (ref >90)
GFR calc non Af Amer: >90 mL/min (ref >90)
Glucose, Bld: 102 mg/dL — ABNORMAL HIGH (ref 70–99)
Potassium: 4.2 mEq/L (ref 3.7–5.3)
SODIUM: 142 meq/L (ref 137–147)
Total Protein: 5.6 g/dL — ABNORMAL LOW (ref 6.0–8.3)

## 2014-01-06 LAB — CBC WITH DIFFERENTIAL/PLATELET
Basophils Absolute: 0 10*3/uL (ref 0.0–0.1)
Basophils Relative: 0 % (ref 0–1)
EOS ABS: 0 10*3/uL (ref 0.0–0.7)
EOS PCT: 0 % (ref 0–5)
HEMATOCRIT: 37.5 % — AB (ref 39.0–52.0)
Hemoglobin: 12.4 g/dL — ABNORMAL LOW (ref 13.0–17.0)
LYMPHS ABS: 0.9 10*3/uL (ref 0.7–4.0)
LYMPHS PCT: 13 % (ref 12–46)
MCH: 29.3 pg (ref 26.0–34.0)
MCHC: 33.1 g/dL (ref 30.0–36.0)
MCV: 88.7 fL (ref 78.0–100.0)
MONO ABS: 0.8 10*3/uL (ref 0.1–1.0)
Monocytes Relative: 11 % (ref 3–12)
Neutro Abs: 5.3 10*3/uL (ref 1.7–7.7)
Neutrophils Relative %: 76 % (ref 43–77)
Platelets: 141 10*3/uL — ABNORMAL LOW (ref 150–400)
RBC: 4.23 MIL/uL (ref 4.22–5.81)
RDW: 13.3 % (ref 11.5–15.5)
WBC: 7 10*3/uL (ref 4.0–10.5)

## 2014-01-06 LAB — GLUCOSE, CAPILLARY
GLUCOSE-CAPILLARY: 94 mg/dL (ref 70–99)
Glucose-Capillary: 104 mg/dL — ABNORMAL HIGH (ref 70–99)
Glucose-Capillary: 155 mg/dL — ABNORMAL HIGH (ref 70–99)
Glucose-Capillary: 82 mg/dL (ref 70–99)

## 2014-01-06 MED ORDER — LISINOPRIL 10 MG PO TABS
20.0000 mg | ORAL_TABLET | Freq: Every day | ORAL | Status: DC
  Administered 2014-01-06: 20 mg via ORAL
  Filled 2014-01-06: qty 2

## 2014-01-06 MED ORDER — HALOPERIDOL LACTATE 5 MG/ML IJ SOLN
2.0000 mg | Freq: Once | INTRAMUSCULAR | Status: AC
  Administered 2014-01-06: 2 mg via INTRAVENOUS
  Filled 2014-01-06: qty 1

## 2014-01-06 MED ORDER — PREDNISONE 10 MG PO TABS
ORAL_TABLET | ORAL | Status: DC
Start: 2014-01-06 — End: 2014-01-07

## 2014-01-06 MED ORDER — LORAZEPAM 0.5 MG PO TABS: 0.5000 mg | ORAL_TABLET | Freq: Four times a day (QID) | ORAL | Status: DC | PRN

## 2014-01-06 MED ORDER — LORAZEPAM 2 MG/ML IJ SOLN
1.0000 mg | Freq: Once | INTRAMUSCULAR | Status: AC
  Administered 2014-01-06: 1 mg via INTRAVENOUS

## 2014-01-06 MED ORDER — IPRATROPIUM-ALBUTEROL 0.5-2.5 (3) MG/3ML IN SOLN
3.0000 mL | Freq: Three times a day (TID) | RESPIRATORY_TRACT | Status: DC
  Administered 2014-01-06: 3 mL via RESPIRATORY_TRACT
  Filled 2014-01-06: qty 3

## 2014-01-06 MED ORDER — IPRATROPIUM-ALBUTEROL 0.5-2.5 (3) MG/3ML IN SOLN: 3.0000 mL | Freq: Four times a day (QID) | RESPIRATORY_TRACT | Status: DC | PRN

## 2014-01-06 MED ORDER — AMLODIPINE BESYLATE 5 MG PO TABS
5.0000 mg | ORAL_TABLET | Freq: Every day | ORAL | Status: DC
  Administered 2014-01-06: 5 mg via ORAL
  Filled 2014-01-06: qty 1

## 2014-01-06 NOTE — Progress Notes (Signed)
Pt discharged back to [facility] per Dr. Kerry Hough. Pt's IV site D/C'd and WDL. Pt's foley removed. WDL. Pt's VSS. Report called to [facility], nurse. Verbalized understanding. Pt left floor in stable condition.  Respirations somewhat labored, however patient's O2 saturation normal. Pt alert and oriented. FL2 packet given to EMS transporters.

## 2014-01-06 NOTE — Progress Notes (Signed)
Pt now calm and resting. Safety mitts are on hands. Bed is in lowest position & call bell within reach. Will continue to monitor.

## 2014-01-06 NOTE — Clinical Social Work Note (Signed)
Pt d/c today back to [facility]. MD discussed hospice with pt this morning and he was agreeable. Pt's guardian aware that [facility] will arrange upon return. Debbie at facility notified. CSW provided support. D/C summary faxed. Pt to transport via [facility]. Out of facility DNR sent with pt.  Derenda Fennel, [place] [phone]

## 2014-01-06 NOTE — Progress Notes (Addendum)
Pts bed alarm went off. Nurse went immedicately to check on pt. Pt standing, oob. Nurse assisting pt to bed. Pt being combative to nursing staff. CBG checked= 104. O2 sat 96% on room air. Pt kicking and trying to hit staff. It took 4 nurses/techs to prevent pt from kicking staff and administer medication. Adminsitered .25 mg ativan which had no effect. Notified dr Sharl Ma, who ordered and additional  IV STAT. After both ativan doses, pt continues to attempt to pull out iv and foley and kick/hit. Notified Dr Sharl Ma again, who ordered  haldol IV STAT. Administering that now.

## 2014-01-06 NOTE — Discharge Summary (Signed)
Physician Discharge Summary  Jason Cisneros ZOX:[id number] DOB: 02-May-1955 DOA: 01/02/2014  PCP: Pearson Grippe, MD  Admit date: 01/02/2014 Discharge date: 01/06/2014  Time spent:  Recommendations for Outpatient Follow-up:  1. Patient will be discharged back to [facility] nursing facility 2. Will arrange for hospice to follow the patient at [facility] 3. If patient begins to decline, would recommend transitioning his care to full comfort measures 4. Consider outpatient neurology referral for EMG/nerve conduction studies if patient begins to improve.  Discharge Diagnoses:  Active Problems:   Acute respiratory failure with hypoxia and hypercarbia   Thrombocytopenia, unspecified   Hypernatremia   Hyperglycemia   History of CVA (cerebrovascular accident)   Dysphagia, unspecified(787.20)   Aspiration of food possible ALS  Discharge Condition: stable  Diet recommendation: pureed diet with thin liquids, patient should be sitting up at 90 degrees and should only eat with supervision  Filed Weights   01/04/14 0448 01/05/14 0500 01/06/14 0604  Weight: 57.8 kg (127 lb 6.8 oz) 58.1 kg (128 lb 1.4 oz) 57.153 kg (126 lb)    History of present illness and hospital course:  This patient presents to the hospital with progressive shortness of breath, respiratory failure with hypoxia and hypercarbia. He required BiPAP on admission. Patient has known COPD and continues to smoke. He is a resident of a skilled nursing facility. Patient was started on intravenous steroids, antibiotics and bronchodilators. He's now been weaned off of oxygen and appears to be back to his baseline. He was placed on a prednisone taper. He has completed adequate antibiotics and will be continued on bronchodilators. He has a prior history of stroke and has baseline dysarthria. It was noted that he was having significant difficulty eating and drinking. He was seen by speech therapy and underwent modified barium swallow. It was  noted that he was aspirating with all forms of solids and liquids. Alternative nutrition such as a PEG tube was recommended. This was discussed with the patient and he adamantly refused placement of PEG tube. He was to continue taking solids as liquids by mouth and accept the risk for aspiration. Per speech therapy, and pured diet with nectar thick liquids was recommended to minimize risk.   Apparently over the past 6 months has had significant neurologic decline. His guardian reports that he was essentially walking in January. At this time he is confined to a wheelchair. He has been seen by neurology in the past and also during this hospitalization. On evaluation by neurology, it was noted that he had upper motor neuron findings involving the legs and lower motoneuron findings in both upper extremities. 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 This will need to be further diagnosed with dedicated and extensive nerve conduction studies and needle electromyography.  With the patient's progressive neurologic decline, severe dysarthria and dysphagia and continuous aspiration, CODE STATUS was discussed with the patient and he has agreed to a DO NOT RESUSCITATE status. Furthermore, I strongly recommended hospice services. The patient has been declining over the last 6 months we'll likely continue to do so. He has refused the PEG tube placement, and doubtful that he'll be able to obtain enough nutrition by mouth to sustain his needs. He'll likely either have recurrent pneumonias and respiratory failure were become increasingly weak/dehydrated and decline. At the hospice services would be very appropriate in this situation. I have discussed this with the patient as well as his guardian. We will ask hospice services to evaluate and follow the  patient at the nursing facility. The patient is currently stable and ready for discharge, but if he begins to decline, then I would strongly  recommend full comfort measures, as aggressive therapy will not change his prognosis.  Procedures:    Consultations:  Neurology  Discharge Exam: Filed Vitals:   01/06/14 0825  BP: 171/86  Pulse: 62  Temp:   Resp:     General: NAD Cardiovascular: s1, s2, rrr Respiratory: coarse breath sounds bilaterally that clear with coughing  Discharge Instructions You were cared for by a hospitalist during your hospital stay. If you have any questions about your discharge medications or the care you received while you were in the hospital after you are discharged, you can call the unit and asked to speak with the hospitalist on call if the hospitalist that took care of you is not available. Once you are discharged, your primary care physician will handle any further medical issues. Please note that NO REFILLS for any discharge medications will be authorized once you are discharged, as it is imperative that you return to your primary care physician (or establish a relationship with a primary care physician if you do not have one) for your aftercare needs so that they can reassess your need for medications and monitor your lab values.  Discharge Instructions   Call MD for:  difficulty breathing, headache or visual disturbances    Complete by:  As directed      Call MD for:  temperature >100.4    Complete by:  As directed      Diet - low sodium heart healthy    Complete by:  As directed      Increase activity slowly    Complete by:  As directed           Current Discharge Medication List    START taking these medications   Details  ipratropium-albuterol (DUONEB) 0.5-2.5 (3) MG/3ML SOLN Take 3 mLs by nebulization every 6 (six) hours as needed (shortness of breath). Qty: 360 mL    predniSONE (DELTASONE) 10 MG tablet Take  po daily for 2 days then  po daily for 2 days, then  po daily for 2 days then  po daily for 2 days then stop      CONTINUE these medications which have  CHANGED   Details  LORazepam (ATIVAN) 0.5 MG tablet Place 1 tablet (0.5 mg total) under the tongue every 6 (six) hours as needed for anxiety. Qty: 30 tablet, Refills: 0      CONTINUE these medications which have NOT CHANGED   Details  amLODipine (NORVASC) 5 MG tablet Take 1 tablet by mouth daily.    aspirin EC 81 MG tablet Take 81 mg by mouth daily. For status post cva.    atorvastatin (LIPITOR) 20 MG tablet Take 20 mg by mouth every evening.    baclofen (LIORESAL) 20 MG tablet Take 20 mg by mouth 2 (two) times daily. For gait disturbances,muscle spasm    budesonide (PULMICORT) 0.5 MG/2ML nebulizer solution Take 0.5 mg by nebulization 2 (two) times daily.    cholecalciferol (VITAMIN D) 1000 UNITS tablet Take 1,000 Units by mouth daily.    levalbuterol (XOPENEX) 0.31 MG/3ML nebulizer solution Take 1 ampule by nebulization 3 (three) times daily.    lisinopril (PRINIVIL,ZESTRIL) 20 MG tablet Take 1 tablet by mouth daily.    meloxicam (MOBIC) 15 MG tablet Take 15 mg by mouth 2 (two) times daily. For gout.    mirtazapine (REMERON) 30 MG  tablet Take 30 mg by mouth at bedtime.    nicotine (NICODERM CQ - DOSED IN MG/24 HOURS) 21 mg/24hr patch Place 21 mg onto the skin daily.    Oxcarbazepine (TRILEPTAL) 300 MG tablet Take 300 mg by mouth 2 (two) times daily.      STOP taking these medications     methylPREDNISolone sodium succinate (SOLU-MEDROL) 125 mg/2 mL injection        No Known Allergies    The results of significant diagnostics from this hospitalization (including imaging, microbiology, ancillary and laboratory) are listed below for reference.    Significant Diagnostic Studies: Dg Chest Portable 1 View  01/03/2014   CLINICAL DATA:  COPD, respiratory distress  EXAM: PORTABLE CHEST - 1 VIEW  COMPARISON:  08/03/2013  FINDINGS: The heart size and mediastinal contours are within normal limits. Both lungs are clear. The visualized skeletal structures are unremarkable.   IMPRESSION: No active disease.   Electronically Signed   By: Esperanza Heir M.D.   On: 01/03/2014 00:33   Dg Swallowing Func-speech Pathology  01/04/2014   Dorene Ar, CCC-SLP     01/04/2014  3:08 PM Objective Swallowing Evaluation: Modified Barium Swallowing Study   Patient Details  Name: Jason Cisneros MRN: [id number] Date of Birth: [birth year]  Today's Date: 01/04/2014 Time: 1330-1400 SLP Time Calculation (min): 30 min  Past Medical History:  Past Medical History  Diagnosis Date  . Back pain   . Dementia   . Hyperlipidemia   . Gait disturbance   . Depressive disorder   . COPD (chronic obstructive pulmonary disease)   . Dysphagia   . CVA (cerebral infarction)   . Stroke   . Hypertension    Past Surgical History:  Past Surgical History  Procedure Laterality Date  . Cholecystectomy     HPI:  Mr. Jason Cisneros is a 58 y.o. year old male with significant past  medical history of COPD, hypertension, CVA presenting with acute  respiratory failure with hypoxia and hypercarbia. Level V caveat  as patient is acutely in respiratory distress and is nonverbal at  baseline. Per report, patient with worsening respiratory status  over the past 24 hours at his skilled nursing facility. Was given  Solu-Medrol Xopenex at the living facility per her report with  minimal improvement in symptoms. Still smoking daily. Patient was  noted to be satting in the mid 70s on 10 L. He is a resident at  [facility]. Pt is currently NPO. SLP spoke with SLP at [facility] and  picked up his computer to facilitate communication. He reportedly  consumes puree diet with thin liquids, but always sounds  wet/congested. Pt has severe/profound dysarthria negatively  impacting expressive language skills. He uses a computer to  communicate and writing on paper.      Assessment / Plan / Recommendation Clinical Impression  Dysphagia Diagnosis: Severe pharyngeal phase dysphagia Clinical impression: Mr. Porcaro presents with severe pharyngeal  phase dysphagia  characterized by premature spillage to the  valleculae, decreased tongue base retraction, epiglottic  deflection, hyolaryngeal excursion, and CP relaxation resulting  in overall decreased pharyngeal pressure with stasis of bolus in  valleculae and pyriforms after the swallow and inability to  clear. Pt with severe difficulty passing puree through UES. He  was more successful in swallowing thin and nectars (through UES)  via sequential swallows, however he was also more likely to  aspirate the liquids. ~50% of the liquid bolus passed through UES  while the remaining was retained in pharynx  eventually leading to  penetration and aspiration. Pt was made efforts to cough to clear  aspirates, however was unsuccessful due to poor respiratory  effort. Pt is judged to be at high risk for aspiration of all  consistencies and textures with the majority of the bolus left in  pharynx and pt unable to clear. Recommend NPO with alternative  source of nutrition, long term. SLP called and spoke to his  friend, [name] (former fiance/girlfriend) who reports that pt  has resided at [facility] for ~6 months. She reports that he has been  quick to cry (labile) for years and stuttered slightly. He saw  Dr. Gerilyn Pilgrim in the recent past for gait instability/falls,  however it does not appear that he has had a brain MRI. [name]  reports that there has been a rapid decline in pt's ability to  speak and swallow in the last 3 months with significant weight  loss. She also reports that he was never hospitalized for swallow  dysfunction/stroke. Pt with severe/profound dysarthria and  dysphagia- query bulbar involvement. Recommendations discussed  with Dr. Kerry Hough. Dr. Gerilyn Pilgrim has not seen the pt during this  admission and his input may be insightful given the reported  recent rapid decline in function. If pt does not want a feeding  tube and opts for "safest diet" with the understanding that  aspiration is known and likely, recommend puree with NTL.  SLP  will follow while in acute setting for goals of care (per MD).     Treatment Recommendation  Therapy as outlined in treatment plan below    Diet Recommendation NPO;Alternative means - long-term        Other  Recommendations Oral Care Recommendations: Oral care Q4  per protocol   Follow Up Recommendations  Skilled Nursing facility    Frequency and Duration min 2x/week  2 weeks       SLP Swallow Goals  Follow for goal of care tomorrow   General Date of Onset: 01/02/14 HPI: Mr. Diamante Rubin is a 58 y.o. year old male with significant  past medical history of COPD, hypertension, CVA presenting with  acute respiratory failure with hypoxia and hypercarbia. Level V  caveat as patient is acutely in respiratory distress and is  nonverbal at baseline. Per report, patient with worsening  respiratory status over the past 24 hours at his skilled nursing  facility. Was given Solu-Medrol Xopenex at the living facility  per her report with minimal improvement in symptoms. Still  smoking daily. Patient was noted to be satting in the mid 70s on  10 L. He is a resident at [facility]. Pt is currently NPO. SLP spoke  with SLP at [facility] and picked up his computer to facilitate  communication. He reportedly consumes puree diet with thin  liquids, but always sounds wet/congested. Pt has severe/profound  dysarthria negatively impacting expressive language skills. He  uses a computer to communicate and writing on paper.  Type of Study: Modified Barium Swallowing Study Reason for Referral: Objectively evaluate swallowing function Previous Swallow Assessment: BSE yesterday Diet Prior to this Study: NPO Temperature Spikes Noted: No Respiratory Status: Nasal cannula History of Recent Intubation: No Behavior/Cognition: Alert;Cooperative;Pleasant mood;Requires  cueing Oral Cavity - Dentition: Edentulous Oral Motor / Sensory Function: Impaired - see Bedside swallow  eval Self-Feeding Abilities: Needs assist Patient Positioning: Upright in bed  Baseline Vocal Quality: Wet Volitional Cough: Weak;Congested;Wet Volitional Swallow: Unable to elicit Anatomy: Within functional limits Pharyngeal Secretions: Not observed secondary MBS    Reason  for Referral Objectively evaluate swallowing function   Oral Phase Oral Preparation/Oral Phase Oral Phase: Impaired Oral - Nectar Oral - Nectar Cup: Weak lingual manipulation Oral - Thin Oral - Thin Teaspoon: Weak lingual manipulation Oral - Solids Oral - Puree: Weak lingual manipulation   Pharyngeal Phase Pharyngeal Phase Pharyngeal Phase: Impaired Pharyngeal - Nectar Pharyngeal - Nectar Cup: Premature spillage to valleculae;Reduced  pharyngeal peristalsis;Reduced epiglottic inversion;Reduced  anterior laryngeal mobility;Reduced laryngeal elevation;Reduced  airway/laryngeal closure;Reduced tongue base  retraction;Penetration/Aspiration during  swallow;Penetration/Aspiration after swallow;Trace  aspiration;Moderate aspiration;Pharyngeal residue -  valleculae;Pharyngeal residue - pyriform sinuses Penetration/Aspiration details (nectar cup): Material enters  airway, passes BELOW cords and not ejected out despite cough  attempt by patient;Material enters airway, passes BELOW cords  without attempt by patient to eject out (silent  aspiration);Material enters airway, passes BELOW cords then  ejected out Pharyngeal - Thin Pharyngeal - Thin Teaspoon: Premature spillage to  valleculae;Reduced epiglottic inversion;Reduced anterior  laryngeal mobility;Reduced laryngeal elevation;Reduced  airway/laryngeal closure;Reduced tongue base  retraction;Penetration/Aspiration before swallow;Pharyngeal  residue - pyriform sinuses;Pharyngeal residue - valleculae Penetration/Aspiration details (thin teaspoon): Material enters  airway, remains ABOVE vocal cords and not ejected out;Material  enters airway, remains ABOVE vocal cords then ejected out Pharyngeal - Thin Cup: Premature spillage to valleculae;Reduced  pharyngeal peristalsis;Reduced  epiglottic inversion;Reduced  anterior laryngeal mobility;Reduced laryngeal elevation;Reduced  airway/laryngeal closure;Reduced tongue base  retraction;Penetration/Aspiration during  swallow;Penetration/Aspiration after swallow;Trace  aspiration;Moderate aspiration;Pharyngeal residue -  valleculae;Pharyngeal residue - pyriform sinuses;Lateral channel  residue Penetration/Aspiration details (thin cup): Material enters  airway, passes BELOW cords without attempt by patient to eject  out (silent aspiration);Material enters airway, passes BELOW  cords and not ejected out despite cough attempt by  patient;Material enters airway, passes BELOW cords then ejected  out;Material enters airway, CONTACTS cords and not ejected out Pharyngeal - Thin Straw: Premature spillage to valleculae;Reduced  pharyngeal peristalsis;Reduced epiglottic inversion;Reduced  anterior laryngeal mobility;Reduced laryngeal elevation;Reduced  airway/laryngeal closure;Reduced tongue base  retraction;Penetration/Aspiration during  swallow;Penetration/Aspiration after swallow;Trace  aspiration;Moderate aspiration;Pharyngeal residue -  valleculae;Pharyngeal residue - pyriform sinuses;Lateral channel  residue Penetration/Aspiration details (thin straw): Material enters  airway, passes BELOW cords without attempt by patient to eject  out (silent aspiration);Material enters airway, passes BELOW  cords and not ejected out despite cough attempt by  patient;Material enters airway, passes BELOW cords then ejected  out;Material enters airway, CONTACTS cords and not ejected out Pharyngeal - Solids Pharyngeal - Puree: Delayed swallow initiation;Premature spillage  to valleculae;Reduced epiglottic inversion;Reduced anterior  laryngeal mobility;Reduced laryngeal elevation;Reduced pharyngeal  peristalsis;Penetration/Aspiration after swallow;Pharyngeal  residue - valleculae;Pharyngeal residue - pyriform sinuses Penetration/Aspiration details (puree): Material enters  airway,  remains ABOVE vocal cords and not ejected out  Cervical Esophageal Phase       Cervical Esophageal Phase Cervical Esophageal Phase: Impaired Cervical Esophageal Phase - Solids Puree: Reduced cricopharyngeal relaxation        Thank you,  Havery Moros, CCC-SLP [phone]  PORTER,DABNEY 01/04/2014, 3:07 PM     Microbiology: Recent Results (from the past 240 hour(s))  CULTURE, BLOOD (ROUTINE X 2)     Status: None   Collection Time    01/03/14 12:16 AM      Result Value Ref Range Status   Specimen Description BLOOD LEFT FOREARM   Final   Special Requests     Final   Value: BOTTLES DRAWN AEROBIC AND ANAEROBIC AEB=8CC ANA=4CC   Culture NO GROWTH 3 DAYS   Final   Report Status PENDING   Incomplete  CULTURE, BLOOD (ROUTINE X 2)  Status: None   Collection Time    01/03/14 12:16 AM      Result Value Ref Range Status   Specimen Description BLOOD LEFT HAND   Final   Special Requests     Final   Value: BOTTLES DRAWN AEROBIC AND ANAEROBIC AEB=8CC ANA=6CC   Culture NO GROWTH 3 DAYS   Final   Report Status PENDING   Incomplete  URINE CULTURE     Status: None   Collection Time    01/03/14  3:00 AM      Result Value Ref Range Status   Specimen Description URINE, CLEAN CATCH   Final   Special Requests NONE   Final   Culture  Setup Time     Final   Value: 01/03/2014 10:30     Performed at [facility] Count     Final   Value: NO GROWTH     Performed at [facility]   Culture     Final   Value: NO GROWTH     Performed at [facility]   Report Status 01/04/2014 FINAL   Final  MRSA PCR SCREENING     Status: None   Collection Time    01/03/14  3:00 AM      Result Value Ref Range Status   MRSA by PCR NEGATIVE  NEGATIVE Final   Comment:            The GeneXpert MRSA Assay (FDA     approved for NASAL specimens     only), is one component of a     comprehensive MRSA colonization     surveillance program. It is not     intended to diagnose MRSA      infection nor to guide or     monitor treatment for     MRSA infections.     Labs: Basic Metabolic Panel:  Recent Labs Lab 01/02/14 2337 01/03/14 0427 01/04/14 0820 01/05/14 0500 01/06/14 0601  NA 146 148* 145 141 142  K 4.4 4.4 4.7 5.0 4.2  CL 101 104 105 103 104  CO2 34* 32 32 31 29  GLUCOSE 232* 186* 98 129* 102*  BUN 25* 28* 30* 28* 26*  CREATININE 0.78 0.83 0.79 0.71 0.67  CALCIUM 9.6 8.8 8.9 8.7 8.7   Liver Function Tests:  Recent Labs Lab 01/03/14 0427 01/04/14 0820 01/05/14 0500 01/06/14 0601  AST ALT ALKPHOS 68 54 56 50  BILITOT <0.2* 0.2* 0.2* 0.3  PROT 6.3 5.8* 6.1 5.6*  ALBUMIN 3.7 3.3* 3.4* 3.1*   No results found for this basename: LIPASE, AMYLASE,  in the last 168 hours No results found for this basename: AMMONIA,  in the last 168 hours CBC:  Recent Labs Lab 01/02/14 2337 01/03/14 0427 01/04/14 0820 01/05/14 0500 01/06/14 0601  WBC 8.8 12.6* 8.4 5.3 7.0  NEUTROABS 7.8* 11.7* 6.2 4.6 5.3  HGB 16.3 14.1 12.1* 13.0 12.4*  HCT 49.8 43.5 37.7* 39.7 37.5*  MCV 92.7 92.4 92.2 90.6 88.7  PLT 170 141* 120* 122* 141*   Cardiac Enzymes:  Recent Labs Lab 01/02/14 2337 01/03/14 0205 01/03/14 0809 01/03/14 1408 01/05/14 0500  CKTOTAL  --   --   --   --  107  CKMB  --   --   --   --  5.3*  TROPONINI <0.30 <0.30 <0.30 <0.30  --    BNP: BNP (last 3  results)  Recent Labs  01/02/14 2337  PROBNP 231.3*   CBG:  Recent Labs Lab 01/05/14 1127 01/05/14 1746 01/06/14 0511 01/06/14 0745 01/06/14 1151  GLUCAP 108* 139* 104* 94 155*       Signed:  Jennell Janosik  [facility] 01/06/2014, 12:46 PM

## 2014-01-07 ENCOUNTER — Inpatient Hospital Stay (HOSPITAL_COMMUNITY)
Admission: EM | Admit: 2014-01-07 | Discharge: 2014-01-10 | DRG: 189 | Disposition: A | Discharge status: Skilled Nursing Facility | Payer: Medicare Other | Attending: Internal Medicine | Admitting: Internal Medicine

## 2014-01-07 ENCOUNTER — Emergency Department (HOSPITAL_COMMUNITY): Payer: Medicare Other

## 2014-01-07 ENCOUNTER — Encounter (HOSPITAL_COMMUNITY): Payer: Self-pay | Admitting: Emergency Medicine

## 2014-01-07 DIAGNOSIS — Z515 Encounter for palliative care: Secondary | ICD-10-CM

## 2014-01-07 DIAGNOSIS — J439 Emphysema, unspecified: Secondary | ICD-10-CM

## 2014-01-07 DIAGNOSIS — J9601 Acute respiratory failure with hypoxia: Secondary | ICD-10-CM

## 2014-01-07 DIAGNOSIS — IMO0002 Reserved for concepts with insufficient information to code with codable children: Secondary | ICD-10-CM

## 2014-01-07 DIAGNOSIS — F0390 Unspecified dementia without behavioral disturbance: Secondary | ICD-10-CM | POA: Diagnosis present

## 2014-01-07 DIAGNOSIS — J441 Chronic obstructive pulmonary disease with (acute) exacerbation: Secondary | ICD-10-CM | POA: Diagnosis present

## 2014-01-07 DIAGNOSIS — R0902 Hypoxemia: Secondary | ICD-10-CM | POA: Diagnosis present

## 2014-01-07 DIAGNOSIS — F411 Generalized anxiety disorder: Secondary | ICD-10-CM | POA: Diagnosis present

## 2014-01-07 DIAGNOSIS — Z8673 Personal history of transient ischemic attack (TIA), and cerebral infarction without residual deficits: Secondary | ICD-10-CM

## 2014-01-07 DIAGNOSIS — Z7982 Long term (current) use of aspirin: Secondary | ICD-10-CM

## 2014-01-07 DIAGNOSIS — R4701 Aphasia: Secondary | ICD-10-CM | POA: Diagnosis present

## 2014-01-07 DIAGNOSIS — J9600 Acute respiratory failure, unspecified whether with hypoxia or hypercapnia: Secondary | ICD-10-CM | POA: Diagnosis not present

## 2014-01-07 DIAGNOSIS — J189 Pneumonia, unspecified organism: Secondary | ICD-10-CM

## 2014-01-07 DIAGNOSIS — F17200 Nicotine dependence, unspecified, uncomplicated: Secondary | ICD-10-CM | POA: Diagnosis present

## 2014-01-07 DIAGNOSIS — Z7401 Bed confinement status: Secondary | ICD-10-CM

## 2014-01-07 DIAGNOSIS — T17920A Food in respiratory tract, part unspecified causing asphyxiation, initial encounter: Secondary | ICD-10-CM

## 2014-01-07 DIAGNOSIS — R471 Dysarthria and anarthria: Secondary | ICD-10-CM

## 2014-01-07 DIAGNOSIS — G1221 Amyotrophic lateral sclerosis: Secondary | ICD-10-CM | POA: Diagnosis present

## 2014-01-07 DIAGNOSIS — Z993 Dependence on wheelchair: Secondary | ICD-10-CM

## 2014-01-07 DIAGNOSIS — E785 Hyperlipidemia, unspecified: Secondary | ICD-10-CM | POA: Diagnosis present

## 2014-01-07 DIAGNOSIS — I10 Essential (primary) hypertension: Secondary | ICD-10-CM | POA: Diagnosis present

## 2014-01-07 DIAGNOSIS — R0602 Shortness of breath: Secondary | ICD-10-CM | POA: Diagnosis not present

## 2014-01-07 DIAGNOSIS — Z66 Do not resuscitate: Secondary | ICD-10-CM | POA: Diagnosis present

## 2014-01-07 DIAGNOSIS — J690 Pneumonitis due to inhalation of food and vomit: Secondary | ICD-10-CM | POA: Diagnosis present

## 2014-01-07 DIAGNOSIS — R1310 Dysphagia, unspecified: Secondary | ICD-10-CM | POA: Diagnosis present

## 2014-01-07 DIAGNOSIS — T17928A Food in respiratory tract, part unspecified causing other injury, initial encounter: Secondary | ICD-10-CM

## 2014-01-07 LAB — CBC WITH DIFFERENTIAL/PLATELET
BASOS PCT: 0 % (ref 0–1)
Basophils Absolute: 0 10*3/uL (ref 0.0–0.1)
EOS ABS: 0 10*3/uL (ref 0.0–0.7)
EOS PCT: 0 % (ref 0–5)
HCT: 45.7 % (ref 39.0–52.0)
Hemoglobin: 15.3 g/dL (ref 13.0–17.0)
Lymphocytes Relative: 12 % (ref 12–46)
Lymphs Abs: 1.2 10*3/uL (ref 0.7–4.0)
MCH: 29.8 pg (ref 26.0–34.0)
MCHC: 33.5 g/dL (ref 30.0–36.0)
MCV: 88.9 fL (ref 78.0–100.0)
MONOS PCT: 9 % (ref 3–12)
Monocytes Absolute: 0.9 10*3/uL (ref 0.1–1.0)
Neutro Abs: 7.7 10*3/uL (ref 1.7–7.7)
Neutrophils Relative %: 78 % — ABNORMAL HIGH (ref 43–77)
Platelets: 147 10*3/uL — ABNORMAL LOW (ref 150–400)
RBC: 5.14 MIL/uL (ref 4.22–5.81)
RDW: 13.5 % (ref 11.5–15.5)
WBC: 9.9 10*3/uL (ref 4.0–10.5)

## 2014-01-07 LAB — BASIC METABOLIC PANEL
Anion gap: 12 (ref 5–15)
BUN: 26 mg/dL — AB (ref 6–23)
CALCIUM: 9 mg/dL (ref 8.4–10.5)
CO2: 30 mEq/L (ref 19–32)
CREATININE: 0.66 mg/dL (ref 0.50–1.35)
Chloride: 99 mEq/L (ref 96–112)
GFR calc Af Amer: >90 mL/min (ref >90)
GLUCOSE: 113 mg/dL — AB (ref 70–99)
Potassium: 3.7 mEq/L (ref 3.7–5.3)
Sodium: 141 mEq/L (ref 137–147)

## 2014-01-07 LAB — TROPONIN I: Troponin I: <0.3 ng/mL (ref <0.30)

## 2014-01-07 MED ORDER — PIPERACILLIN-TAZOBACTAM 3.375 G IVPB 30 MIN
3.3750 g | Freq: Once | INTRAVENOUS | Status: AC
  Administered 2014-01-08: 3.375 g via INTRAVENOUS
  Filled 2014-01-07: qty 50

## 2014-01-07 MED ORDER — IOHEXOL 350 MG/ML SOLN: 100.0000 mL | Freq: Once | INTRAVENOUS | Status: AC | PRN

## 2014-01-07 NOTE — ED Provider Notes (Signed)
CSN: [id number]     Arrival date & time 01/07/14  2225 History   First MD Initiated Contact with Patient 01/07/14 2304    This chart was scribed for Enid Skeens, MD by Marica Otter, ED Scribe. This patient was seen in room APA02/APA02 and the patient's care was started at 11:19 PM. LEVEL 5 CAVEAT: ACUITY OF CONDITION  Chief Complaint  Patient presents with  . Shortness of Breath   HPI HPI Comments: Dariel Betzer Betzer is a 58 y.o. male, arriving via ambulance, with extensive medical Hx noted below, who presents to the Emergency Department complaining of SOB and hypoxia. During exam pt's O2 levels are at 100% with 3L continuous 02.   Past Medical History  Diagnosis Date  . Back pain   . Dementia   . Hyperlipidemia   . Gait disturbance   . Depressive disorder   . COPD (chronic obstructive pulmonary disease)   . Dysphagia   . CVA (cerebral infarction)   . Stroke   . Hypertension    Past Surgical History  Procedure Laterality Date  . Cholecystectomy     History reviewed. No pertinent family history. History  Substance Use Topics  . Smoking status: Current Every Day Smoker -- 0.10 packs/day    Types: Cigarettes  . Smokeless tobacco: Not on file  . Alcohol Use: No    Review of Systems  Unable to perform ROS: Acuity of condition   A complete 10 system review of systems was obtained and all systems are negative except as noted in the HPI and PMH.     Allergies  Review of patient's allergies indicates no known allergies.  Home Medications   Prior to Admission medications   Medication Sig Start Date End Date Taking? Authorizing Provider  amLODipine (NORVASC) 5 MG tablet Take 1 tablet by mouth daily. 07/25/13  Yes Historical Provider, MD  aspirin EC 81 MG tablet Take 81 mg by mouth daily.    Yes Historical Provider, MD  atorvastatin (LIPITOR) 20 MG tablet Take 20 mg by mouth every evening.   Yes Historical Provider, MD  baclofen (LIORESAL) 20 MG tablet Take 20 mg by mouth 2  (two) times daily. For gait disturbances,muscle spasm   Yes Historical Provider, MD  budesonide (PULMICORT) 0.5 MG/2ML nebulizer solution Take 0.5 mg by nebulization 2 (two) times daily.   Yes Historical Provider, MD  cholecalciferol (VITAMIN D) 1000 UNITS tablet Take 1,000 Units by mouth daily.   Yes Historical Provider, MD  ipratropium-albuterol (DUONEB) 0.5-2.5 (3) MG/3ML SOLN Take 3 mLs by nebulization every 6 (six) hours as needed (Shortness Of Breath).   Yes Historical Provider, MD  levalbuterol (XOPENEX) 0.31 MG/3ML nebulizer solution Take 1 ampule by nebulization 3 (three) times daily.   Yes Historical Provider, MD  lisinopril (PRINIVIL,ZESTRIL) 20 MG tablet Take 1 tablet by mouth daily. 04/16/13  Yes Historical Provider, MD  meloxicam (MOBIC) 15 MG tablet Take 15 mg by mouth 2 (two) times daily. For gout.   Yes Historical Provider, MD  mirtazapine (REMERON) 30 MG tablet Take 30 mg by mouth at bedtime.   Yes Historical Provider, MD  nicotine (NICODERM CQ - DOSED IN MG/24 HOURS) 21 mg/24hr patch Place 21 mg onto the skin daily.   Yes Historical Provider, MD  Oxcarbazepine (TRILEPTAL) 300 MG tablet Take 300 mg by mouth 2 (two) times daily.   Yes Historical Provider, MD  predniSONE (DELTASONE) 10 MG tablet Take 10 mg by mouth 2 (two) times daily with a meal.  For 2 days 01/06/14  Yes Erick Blinks, MD  scopolamine (TRANSDERM-SCOP) 1 MG/3DAYS Place 1 patch onto the skin every 3 (three) days.   Yes Historical Provider, MD  LORazepam (ATIVAN) 0.5 MG tablet Place 1 tablet (0.5 mg total) under the tongue every 6 (six) hours as needed for anxiety. 01/06/14   Erick Blinks, MD   Triage Vitals: BP 193/99  Pulse 93  Temp(Src) 98.3 F (36.8 C) (Oral)  Resp 26  SpO2 100% Physical Exam  Nursing note and vitals reviewed. Constitutional: He is oriented to person, place, and time. He appears well-developed and well-nourished. No distress.  Follows verbal commands but responses to verbal questioning is  indiscernible.   HENT:  Head: Normocephalic and atraumatic.  Eyes: Conjunctivae and EOM are normal. Pupils are equal, round, and reactive to light.  Neck: Neck supple. No tracheal deviation present.  Cardiovascular: Normal rate.   Pulmonary/Chest: Effort normal. No respiratory distress. He has rales.  2+ radial pulses. Mild tachypnea, mild crackled bilateral. Rales left upper. Very congested sounding.   Musculoskeletal: Normal range of motion. He exhibits no edema.  Neurological: He is alert and oriented to person, place, and time.  Skin: Skin is warm and dry.  Psychiatric: He has a normal mood and affect. His behavior is normal.    ED Course  Procedures (including critical care time)  Labs Review Labs Reviewed  CBC WITH DIFFERENTIAL - Abnormal; Notable for the following:    Platelets 147 (*)    Neutrophils Relative % 78 (*)    All other components within normal limits  BASIC METABOLIC PANEL  TROPONIN I    Imaging Review Ct Angio Chest W/cm &/or Wo Cm  01/08/2014   CLINICAL DATA:  COPD, CVA, shortness of breath  EXAM: CT ANGIOGRAPHY CHEST WITH CONTRAST  TECHNIQUE: Multidetector CT imaging of the chest was performed using the standard protocol during bolus administration of intravenous contrast. Multiplanar CT image reconstructions and MIPs were obtained to evaluate the vascular anatomy.  CONTRAST:  OMNIPAQUE IOHEXOL 350 MG/ML SOLN  COMPARISON:  None.  FINDINGS: There is adequate opacification of the pulmonary arteries. There is no pulmonary embolus. The main pulmonary artery, right main pulmonary artery and left main pulmonary arteries are normal in size. The heart size is normal. There is no pericardial effusion.  There is bilateral centrilobular emphysema. There is lingular and left lower lobe reticular nodular interstitial disease which may be secondary to an infectious or inflammatory etiology including atypical infection.  There is no axillary, hilar, or mediastinal  adenopathy.  There is no lytic or blastic osseous lesion. 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  Review of the MIP images confirms the above findings.  IMPRESSION: 1. No evidence of pulmonary embolus. 2. Lingular and left lower lobe reticular nodular interstitial disease concerning for an infectious or inflammatory etiology including atypical infection such as MAI.   Electronically Signed   By: Elige Ko   On: 01/08/2014 00:45   Dg Chest Portable 1 View  01/07/2014   CLINICAL DATA:  Shortness of breath, weakness, confusion.  EXAM: PORTABLE CHEST - 1 VIEW  COMPARISON:  01/02/2014  FINDINGS: Shallow inspiration with elevation of left hemidiaphragm. The heart size and mediastinal contours are within normal limits. Both lungs are clear. Degenerative changes in the shoulders. Probable bilateral rotator cuff arthropathy.  IMPRESSION: No evidence of active pulmonary disease.   Electronically Signed   By: Burman Nieves  M.D.   On: 01/07/2014 22:58     EKG Interpretation   Date/Time:  Saturday January 07 2014 22:44:41 EDT Ventricular Rate:  83 PR Interval:  142 QRS Duration: 88 QT Interval:  364 QTC Calculation: 428 R Axis:   96 Text Interpretation:  Sinus rhythm Borderline right axis deviation  Consider left ventricular hypertrophy overall similar to previous  Confirmed by Alee Gressman  MD, Michelena Culmer ([id number]) on 01/07/2014 11:07:36 PM      MDM  Chest x-ray reviewed by myself concerns for bilateral pneumonia.  Final diagnoses:  None   I personally performed the services described in this documentation, which was scribed in my presence. The recorded information has been reviewed and is accurate.  And patient presents with hypoxia, shortness of breath and cough. Oxygen initially in the 60s improved with BiPAP followed by nasal cannula 4 L. Difficult history as patient has dementia, stroke and nonverbal. Clinical  differential included pneumonia versus heart failure versus other. Clinically patient congested and rales on exam, chest x-ray reviewed and concern for pneumonia reviewed by myself. CT angina chest results reviewed no pulmonary embolism however concern for infectious etiology. Antibiotics and cultures ordered. Discussed the case with Dr. Sharl Ma who accepted the patient to telemetry. There have been discussions of hospice recently however known at bedside to discuss further.  Pt requiring 4 L [place] in ED.  The patients results and plan were reviewed and discussed.   Any x-rays performed were personally reviewed by myself.   Differential diagnosis were considered with the presenting HPI.  Medications  iohexol (OMNIPAQUE) 350 MG/ML injection 100 mL (not administered)  piperacillin-tazobactam (ZOSYN) IVPB 3.375 g (3.375 g Intravenous New Bag/Given 01/08/14 0125)  iohexol (OMNIPAQUE) 350 MG/ML injection 100 mL (100 mLs Intravenous Contrast Given 01/08/14 0010)    Filed Vitals:   01/07/14 2236 01/08/14 0023 01/08/14 0030 01/08/14 0159  BP: 193/99   156/90  Pulse: 93  94 70  Temp: 98.3 F (36.8 C) 98.5 F (36.9 C)    TempSrc: Oral Rectal    Resp: SpO2: 100%  100% 100%    Final diagnoses:  Acute respiratory failure with hypoxia  Hypoxia  HCAP (healthcare-associated pneumonia)    Admission/ observation were discussed with the admitting physician, patient and/or family and they are comfortable with the plan.    Enid Skeens, MD 01/08/14 [phone]

## 2014-01-07 NOTE — ED Notes (Signed)
Pt brought in by [facility] from [facility] for c/o shortness of breath and hypoxia; pt was initially in the 50's with O2 sats and was given O2 and increased to 60's, upon ems arrival pt was put on bipap machine and pt is 99-100%

## 2014-01-07 NOTE — ED Notes (Signed)
MD at bedside. 

## 2014-01-07 NOTE — ED Notes (Signed)
Lab at bedside

## 2014-01-07 NOTE — ED Notes (Signed)
Respiratory at bedside.

## 2014-01-08 ENCOUNTER — Encounter (HOSPITAL_COMMUNITY): Payer: Self-pay

## 2014-01-08 DIAGNOSIS — Z66 Do not resuscitate: Secondary | ICD-10-CM | POA: Diagnosis present

## 2014-01-08 DIAGNOSIS — F0390 Unspecified dementia without behavioral disturbance: Secondary | ICD-10-CM | POA: Diagnosis present

## 2014-01-08 DIAGNOSIS — J9600 Acute respiratory failure, unspecified whether with hypoxia or hypercapnia: Secondary | ICD-10-CM | POA: Diagnosis present

## 2014-01-08 DIAGNOSIS — R1310 Dysphagia, unspecified: Secondary | ICD-10-CM | POA: Diagnosis present

## 2014-01-08 DIAGNOSIS — E785 Hyperlipidemia, unspecified: Secondary | ICD-10-CM | POA: Diagnosis present

## 2014-01-08 DIAGNOSIS — Z7401 Bed confinement status: Secondary | ICD-10-CM | POA: Diagnosis not present

## 2014-01-08 DIAGNOSIS — Z515 Encounter for palliative care: Secondary | ICD-10-CM | POA: Diagnosis not present

## 2014-01-08 DIAGNOSIS — F17200 Nicotine dependence, unspecified, uncomplicated: Secondary | ICD-10-CM | POA: Diagnosis present

## 2014-01-08 DIAGNOSIS — R4701 Aphasia: Secondary | ICD-10-CM | POA: Diagnosis present

## 2014-01-08 DIAGNOSIS — Z7982 Long term (current) use of aspirin: Secondary | ICD-10-CM | POA: Diagnosis not present

## 2014-01-08 DIAGNOSIS — IMO0002 Reserved for concepts with insufficient information to code with codable children: Secondary | ICD-10-CM | POA: Diagnosis not present

## 2014-01-08 DIAGNOSIS — J441 Chronic obstructive pulmonary disease with (acute) exacerbation: Secondary | ICD-10-CM | POA: Diagnosis present

## 2014-01-08 DIAGNOSIS — I10 Essential (primary) hypertension: Secondary | ICD-10-CM | POA: Diagnosis present

## 2014-01-08 DIAGNOSIS — Z8673 Personal history of transient ischemic attack (TIA), and cerebral infarction without residual deficits: Secondary | ICD-10-CM

## 2014-01-08 DIAGNOSIS — Z993 Dependence on wheelchair: Secondary | ICD-10-CM | POA: Diagnosis not present

## 2014-01-08 DIAGNOSIS — F411 Generalized anxiety disorder: Secondary | ICD-10-CM | POA: Diagnosis present

## 2014-01-08 DIAGNOSIS — G1221 Amyotrophic lateral sclerosis: Secondary | ICD-10-CM | POA: Diagnosis present

## 2014-01-08 DIAGNOSIS — R0902 Hypoxemia: Secondary | ICD-10-CM | POA: Diagnosis present

## 2014-01-08 DIAGNOSIS — R0602 Shortness of breath: Secondary | ICD-10-CM | POA: Diagnosis present

## 2014-01-08 DIAGNOSIS — J690 Pneumonitis due to inhalation of food and vomit: Secondary | ICD-10-CM | POA: Diagnosis present

## 2014-01-08 LAB — COMPREHENSIVE METABOLIC PANEL
ALT: 41 U/L (ref 0–53)
AST: 25 U/L (ref 0–37)
Albumin: 3.8 g/dL (ref 3.5–5.2)
Alkaline Phosphatase: 62 U/L (ref 39–117)
Anion gap: 13 (ref 5–15)
BUN: 24 mg/dL — ABNORMAL HIGH (ref 6–23)
CO2: 32 meq/L (ref 19–32)
CREATININE: 0.77 mg/dL (ref 0.50–1.35)
Calcium: 9 mg/dL (ref 8.4–10.5)
Chloride: 98 mEq/L (ref 96–112)
GFR calc Af Amer: >90 mL/min (ref >90)
Glucose, Bld: 87 mg/dL (ref 70–99)
Potassium: 3.5 mEq/L — ABNORMAL LOW (ref 3.7–5.3)
Sodium: 143 mEq/L (ref 137–147)
TOTAL PROTEIN: 6.6 g/dL (ref 6.0–8.3)
Total Bilirubin: 0.4 mg/dL (ref 0.3–1.2)

## 2014-01-08 LAB — PRO B NATRIURETIC PEPTIDE: PRO B NATRI PEPTIDE: 1182 pg/mL — AB (ref 0–125)

## 2014-01-08 LAB — BLOOD GAS, ARTERIAL
Acid-Base Excess: 6 mmol/L — ABNORMAL HIGH (ref 0.0–2.0)
BICARBONATE: 31.2 meq/L — AB (ref 20.0–24.0)
DRAWN BY: 105551
O2 Content: 3.5 L/min
O2 Saturation: 97.6 %
PCO2 ART: 56.8 mmHg — AB (ref 35.0–45.0)
Patient temperature: 37
TCO2: 27.3 mmol/L (ref 0–100)
pH, Arterial: 7.359 (ref 7.350–7.450)
pO2, Arterial: 109 mmHg — ABNORMAL HIGH (ref 80.0–100.0)

## 2014-01-08 LAB — CBC
HCT: 46.6 % (ref 39.0–52.0)
Hemoglobin: 15.3 g/dL (ref 13.0–17.0)
MCH: 29.6 pg (ref 26.0–34.0)
MCHC: 32.8 g/dL (ref 30.0–36.0)
MCV: 90.1 fL (ref 78.0–100.0)
Platelets: 150 10*3/uL (ref 150–400)
RBC: 5.17 MIL/uL (ref 4.22–5.81)
RDW: 13.6 % (ref 11.5–15.5)
WBC: 9.1 10*3/uL (ref 4.0–10.5)

## 2014-01-08 LAB — CULTURE, BLOOD (ROUTINE X 2)
CULTURE: NO GROWTH
Culture: NO GROWTH

## 2014-01-08 MED ORDER — MIRTAZAPINE 30 MG PO TABS
30.0000 mg | ORAL_TABLET | Freq: Every day | ORAL | Status: DC
  Administered 2014-01-08: 30 mg via ORAL
  Filled 2014-01-08: qty 1

## 2014-01-08 MED ORDER — ALBUTEROL SULFATE (2.5 MG/3ML) 0.083% IN NEBU
INHALATION_SOLUTION | RESPIRATORY_TRACT | Status: AC
  Administered 2014-01-08: 2.5 mg
  Filled 2014-01-08: qty 3

## 2014-01-08 MED ORDER — ATORVASTATIN CALCIUM 20 MG PO TABS: 20.0000 mg | ORAL_TABLET | Freq: Every evening | ORAL | Status: DC

## 2014-01-08 MED ORDER — LISINOPRIL 10 MG PO TABS: 20.0000 mg | ORAL_TABLET | Freq: Every day | ORAL | Status: DC

## 2014-01-08 MED ORDER — PIPERACILLIN-TAZOBACTAM 3.375 G IVPB
3.3750 g | Freq: Three times a day (TID) | INTRAVENOUS | Status: DC
  Filled 2014-01-08 (×4): qty 50

## 2014-01-08 MED ORDER — LEVALBUTEROL HCL 0.63 MG/3ML IN NEBU
0.6300 mg | INHALATION_SOLUTION | Freq: Three times a day (TID) | RESPIRATORY_TRACT | Status: DC
  Administered 2014-01-08 – 2014-01-10 (×8): 0.63 mg via RESPIRATORY_TRACT
  Filled 2014-01-08 (×8): qty 3

## 2014-01-08 MED ORDER — PREDNISONE 20 MG PO TABS: 40.0000 mg | ORAL_TABLET | Freq: Every day | ORAL | Status: DC

## 2014-01-08 MED ORDER — AMLODIPINE BESYLATE 5 MG PO TABS: 5.0000 mg | ORAL_TABLET | Freq: Every day | ORAL | Status: DC

## 2014-01-08 MED ORDER — IOHEXOL 350 MG/ML SOLN
100.0000 mL | Freq: Once | INTRAVENOUS | Status: AC | PRN
  Administered 2014-01-08: 100 mL via INTRAVENOUS

## 2014-01-08 MED ORDER — IPRATROPIUM-ALBUTEROL 0.5-2.5 (3) MG/3ML IN SOLN
3.0000 mL | Freq: Four times a day (QID) | RESPIRATORY_TRACT | Status: DC | PRN
Start: 2014-01-08 — End: 2014-01-10
  Administered 2014-01-08 – 2014-01-10 (×3): 3 mL via RESPIRATORY_TRACT
  Filled 2014-01-08 (×3): qty 3

## 2014-01-08 MED ORDER — NICOTINE 21 MG/24HR TD PT24
21.0000 mg | MEDICATED_PATCH | Freq: Every day | TRANSDERMAL | Status: DC
  Administered 2014-01-08 – 2014-01-10 (×3): 21 mg via TRANSDERMAL
  Filled 2014-01-08: qty 1

## 2014-01-08 MED ORDER — ENOXAPARIN SODIUM 40 MG/0.4ML ~~LOC~~ SOLN
40.0000 mg | SUBCUTANEOUS | Status: DC
  Administered 2014-01-08: 40 mg via SUBCUTANEOUS
  Filled 2014-01-08: qty 0.4

## 2014-01-08 MED ORDER — ASPIRIN EC 81 MG PO TBEC: 81.0000 mg | DELAYED_RELEASE_TABLET | Freq: Every day | ORAL | Status: DC

## 2014-01-08 MED ORDER — HYDRALAZINE HCL 25 MG PO TABS: 25.0000 mg | ORAL_TABLET | Freq: Four times a day (QID) | ORAL | Status: DC | PRN

## 2014-01-08 MED ORDER — BUDESONIDE 0.5 MG/2ML IN SUSP
0.5000 mg | Freq: Two times a day (BID) | RESPIRATORY_TRACT | Status: DC
  Administered 2014-01-08 – 2014-01-10 (×4): 0.5 mg via RESPIRATORY_TRACT
  Filled 2014-01-08 (×7): qty 2

## 2014-01-08 MED ORDER — SCOPOLAMINE 1 MG/3DAYS TD PT72
1.0000 | MEDICATED_PATCH | TRANSDERMAL | Status: DC
  Administered 2014-01-08: 1.5 mg via TRANSDERMAL
  Filled 2014-01-08 (×2): qty 1

## 2014-01-08 MED ORDER — LORAZEPAM 0.5 MG PO TABS
0.5000 mg | ORAL_TABLET | Freq: Four times a day (QID) | ORAL | Status: DC | PRN
  Administered 2014-01-09: 0.5 mg via SUBLINGUAL
  Filled 2014-01-08: qty 1

## 2014-01-08 MED ORDER — BACLOFEN 10 MG PO TABS
20.0000 mg | ORAL_TABLET | Freq: Two times a day (BID) | ORAL | Status: DC
  Administered 2014-01-08 – 2014-01-10 (×4): 20 mg via ORAL
  Filled 2014-01-08 (×4): qty 2

## 2014-01-08 MED ORDER — OXCARBAZEPINE 300 MG PO TABS
300.0000 mg | ORAL_TABLET | Freq: Two times a day (BID) | ORAL | Status: DC
  Administered 2014-01-08 – 2014-01-10 (×3): 300 mg via ORAL
  Filled 2014-01-08 (×8): qty 1

## 2014-01-08 MED ORDER — SCOPOLAMINE 1 MG/3DAYS TD PT72
1.0000 | MEDICATED_PATCH | TRANSDERMAL | Status: DC
  Filled 2014-01-08: qty 1

## 2014-01-08 MED ORDER — FUROSEMIDE 10 MG/ML IJ SOLN
40.0000 mg | Freq: Once | INTRAMUSCULAR | Status: AC
  Administered 2014-01-08: 40 mg via INTRAVENOUS
  Filled 2014-01-08: qty 4

## 2014-01-08 NOTE — Progress Notes (Signed)
Patient has held order for IV Lasix.  Benedetto Coons, night floor coverage notified.  Stated to hold the order for now and have the MD reassess in the morning.  Hospice referral for hospice home placement tomorrow.  Oncoming night shift nurse notified.

## 2014-01-08 NOTE — Progress Notes (Signed)
Patient readmitted to the hospital by Dr. Sharl Ma this morning.  Patient seen and examined  He is known to me from prior admission.  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  He has declined a PEG tube, which I agree with.  During his last admission, we had discussed possibilities of hospice and that hospice would follow at [facility].  I think it may be beneficial for patient to be seen by hospice in the hospital and be considered for a hospice home. I think his prognosis is less than 6 weeks. Will request case management to arrange this tomorrow.  Patient is in agreement. Will discuss with his guardian as well.  MEMON,JEHANZEB

## 2014-01-08 NOTE — ED Notes (Addendum)
Called lab to have phlebotomist come collect blood culture. Told she would be down shortly to do so.

## 2014-01-08 NOTE — ED Notes (Signed)
Dr. Lama at bedside. 

## 2014-01-08 NOTE — Progress Notes (Signed)
ANTIBIOTIC CONSULT NOTE - INITIAL  Pharmacy Consult for Zosyn Indication: pneumonia  No Known Allergies  Patient Measurements: Height:  (165.1 cm) Weight: 124 lb 9.6 oz (56.518 kg) IBW/kg (Calculated) : 61.5 Adjusted Body Weight:   Vital Signs: Temp: 98.6 F (37 C) (09/27 0242) Temp src: Oral (09/27 0242) BP: 141/90 mmHg (09/27 0242) Pulse Rate: 65 (09/27 0723) Intake/Output from previous day: 09/26 0701 - 09/27 0700 In: -  Out: 900 [Urine:900] Intake/Output from this shift:    Labs:  Recent Labs  01/06/14 0601 01/07/14 2301 01/08/14 0720  WBC 7.0 9.9 9.1  HGB 12.4* 15.3 15.3  PLT 141* 147* 150  CREATININE 0.67 0.66 0.77   Estimated Creatinine Clearance: 82.4 ml/min (by C-G formula based on Cr of 0.77). No results found for this basename: VANCOTROUGH, VANCOPEAK, VANCORANDOM, GENTTROUGH, GENTPEAK, GENTRANDOM, TOBRATROUGH, TOBRAPEAK, TOBRARND, AMIKACINPEAK, AMIKACINTROU, AMIKACIN,  in the last 72 hours   Microbiology: Recent Results (from the past 720 hour(s))  CULTURE, BLOOD (ROUTINE X 2)     Status: None   Collection Time    01/03/14 12:16 AM      Result Value Ref Range Status   Specimen Description BLOOD LEFT FOREARM   Final   Special Requests     Final   Value: BOTTLES DRAWN AEROBIC AND ANAEROBIC AEB=8CC ANA=4CC   Culture NO GROWTH 5 DAYS   Final   Report Status 01/08/2014 FINAL   Final  CULTURE, BLOOD (ROUTINE X 2)     Status: None   Collection Time    01/03/14 12:16 AM      Result Value Ref Range Status   Specimen Description BLOOD LEFT HAND   Final   Special Requests     Final   Value: BOTTLES DRAWN AEROBIC AND ANAEROBIC AEB=8CC ANA=6CC   Culture NO GROWTH 5 DAYS   Final   Report Status 01/08/2014 FINAL   Final  URINE CULTURE     Status: None   Collection Time    01/03/14  3:00 AM      Result Value Ref Range Status   Specimen Description URINE, CLEAN CATCH   Final   Special Requests NONE   Final   Culture  Setup Time     Final   Value:  01/03/2014 10:30     Performed at [facility] Count     Final   Value: NO GROWTH     Performed at [facility]   Culture     Final   Value: NO GROWTH     Performed at [facility]   Report Status 01/04/2014 FINAL   Final  MRSA PCR SCREENING     Status: None   Collection Time    01/03/14  3:00 AM      Result Value Ref Range Status   MRSA by PCR NEGATIVE  NEGATIVE Final   Comment:            The GeneXpert MRSA Assay (FDA     approved for NASAL specimens     only), is one component of a     comprehensive MRSA colonization     surveillance program. It is not     intended to diagnose MRSA     infection nor to guide or     monitor treatment for     MRSA infections.  CULTURE, BLOOD (ROUTINE X 2)     Status: None   Collection Time    01/08/14  1:07 AM  Result Value Ref Range Status   Specimen Description Blood BLOOD RIGHT HAND   Final   Special Requests     Final   Value: BOTTLES DRAWN AEROBIC AND ANAEROBIC 8CC DRAWN BY RN   Culture NO GROWTH <24 HRS   Final   Report Status PENDING   Incomplete  CULTURE, BLOOD (ROUTINE X 2)     Status: None   Collection Time    01/08/14  1:15 AM      Result Value Ref Range Status   Specimen Description Blood BLOOD RIGHT ARM   Final   Special Requests     Final   Value: BOTTLES DRAWN AEROBIC AND ANAEROBIC 7CC DRAWN BY RN   Culture NO GROWTH <24 HRS   Final   Report Status PENDING   Incomplete    Medical History: Past Medical History  Diagnosis Date  . Back pain   . Dementia   . Hyperlipidemia   . Gait disturbance   . Depressive disorder   . COPD (chronic obstructive pulmonary disease)   . Dysphagia   . CVA (cerebral infarction)   . Stroke   . Hypertension     Medications:  Scheduled:  . amLODipine  5 mg Oral Daily  . aspirin EC  81 mg Oral Daily  . atorvastatin  20 mg Oral QPM  . baclofen  20 mg Oral BID  . budesonide  0.5 mg Nebulization BID  . enoxaparin (LOVENOX) injection  40 mg  Subcutaneous Q24H  . levalbuterol  0.63 mg Nebulization TID  . lisinopril  20 mg Oral Daily  . mirtazapine  30 mg Oral QHS  . nicotine  21 mg Transdermal Daily  . Oxcarbazepine  300 mg Oral BID  . piperacillin-tazobactam (ZOSYN)  IV  3.375 g Intravenous Q8H  . predniSONE  40 mg Oral Q breakfast  . scopolamine  1 patch Transdermal Q72H   Assessment: Acute respiratory failure with hypoxia. HCAP Zosyn 3.375 IV given last evening  Goal of Therapy:  Eradicate infection  Plan:  Zosyn 3.375 GM IV every 8 hours, infuse each dose over 4 hours Monitor renal function Labs per protocol F/U LOT  Raquel James, Nanna Ertle Bennett 01/08/2014,8:49 AM

## 2014-01-08 NOTE — Progress Notes (Signed)
Called by nurse to Room. Patient appears to be dyspneic and labored., Increased his oxygen to 3 lpm/[place] and gave breathing treatment with extra albuterol. Since he is non verbal it is difficult to understand his problems. Nurse reports he received lasix so hopefully he will return to base line. He may still need BiPAP for a short term as EMS had him on CPAP when he arrived. Will Monitor.

## 2014-01-08 NOTE — H&P (Signed)
PCP:   Pearson Grippe, MD   Chief Complaint:  *Shortness of breath  HPI: 58 year old male with significant medical problems including COPD, history of CVA, dysphagia, possible ALS, thrombocytopenia, baseline aphasia  who was discharged from the hospital on 01/06/2014  after he was treated for acute respiratory failure with hypoxia and hypercarbia. Patient was sent to the hospital from the nursing facility with progressive shortness of breath. Patient is a poor historian as is nonverbal though he denies chest pain admits to coughing up phlegm. No fever. In the ED he was started on IV Zosyn. After the CT angiogram showed lingular and left lower lobe reticular nodular interstitial disease concerning for infectious or inflammatory etiology including atypical infection such as MAI.  Allergies:  No Known Allergies    Past Medical History  Diagnosis Date  . Back pain   . Dementia   . Hyperlipidemia   . Gait disturbance   . Depressive disorder   . COPD (chronic obstructive pulmonary disease)   . Dysphagia   . CVA (cerebral infarction)   . Stroke   . Hypertension     Past Surgical History  Procedure Laterality Date  . Cholecystectomy      Prior to Admission medications   Medication Sig Start Date End Date Taking? Authorizing Provider  amLODipine (NORVASC) 5 MG tablet Take 1 tablet by mouth daily. 07/25/13  Yes Historical Provider, MD  aspirin EC 81 MG tablet Take 81 mg by mouth daily.    Yes Historical Provider, MD  atorvastatin (LIPITOR) 20 MG tablet Take 20 mg by mouth every evening.   Yes Historical Provider, MD  baclofen (LIORESAL) 20 MG tablet Take 20 mg by mouth 2 (two) times daily. For gait disturbances,muscle spasm   Yes Historical Provider, MD  budesonide (PULMICORT) 0.5 MG/2ML nebulizer solution Take 0.5 mg by nebulization 2 (two) times daily.   Yes Historical Provider, MD  cholecalciferol (VITAMIN D) 1000 UNITS tablet Take 1,000 Units by mouth daily.   Yes Historical  Provider, MD  ipratropium-albuterol (DUONEB) 0.5-2.5 (3) MG/3ML SOLN Take 3 mLs by nebulization every 6 (six) hours as needed (Shortness Of Breath).   Yes Historical Provider, MD  levalbuterol (XOPENEX) 0.31 MG/3ML nebulizer solution Take 1 ampule by nebulization 3 (three) times daily.   Yes Historical Provider, MD  lisinopril (PRINIVIL,ZESTRIL) 20 MG tablet Take 1 tablet by mouth daily. 04/16/13  Yes Historical Provider, MD  meloxicam (MOBIC) 15 MG tablet Take 15 mg by mouth 2 (two) times daily. For gout.   Yes Historical Provider, MD  mirtazapine (REMERON) 30 MG tablet Take 30 mg by mouth at bedtime.   Yes Historical Provider, MD  nicotine (NICODERM CQ - DOSED IN MG/24 HOURS) 21 mg/24hr patch Place 21 mg onto the skin daily.   Yes Historical Provider, MD  Oxcarbazepine (TRILEPTAL) 300 MG tablet Take 300 mg by mouth 2 (two) times daily.   Yes Historical Provider, MD  predniSONE (DELTASONE) 10 MG tablet Take 10 mg by mouth 2 (two) times daily with a meal. For 2 days 01/06/14  Yes Erick Blinks, MD  scopolamine (TRANSDERM-SCOP) 1 MG/3DAYS Place 1 patch onto the skin every 3 (three) days.   Yes Historical Provider, MD  LORazepam (ATIVAN) 0.5 MG tablet Place 1 tablet (0.5 mg total) under the tongue every 6 (six) hours as needed for anxiety. 01/06/14   Erick Blinks, MD    Social History:  reports that he has been smoking Cigarettes.  He has been smoking about 0.10 packs per  day. He does not have any smokeless tobacco history on file. He reports that he does not drink alcohol. His drug history is not on file.  History reviewed. No pertinent family history.   All the positives are listed in BOLD  Review of Systems:  Unable to obtain as patient is nonverbal   Physical Exam: Blood pressure 156/90, pulse 70, temperature 98.5 F (36.9 C), temperature source Rectal, resp. rate 18, SpO2 100.00%. Constitutional:   Patient is a well-developed and well-nourished male* in no acute distress and cooperative  with exam. Head: Normocephalic and atraumatic Mouth: Mucus membranes moist Eyes: PERRL, EOMI, conjunctivae normal Neck: Supple, No Thyromegaly Cardiovascular: RRR, S1 normal, S2 normal Pulmonary/Chest: Bilateral bibasilar rhonchi Abdominal: Soft. Non-tender, non-distended, bowel sounds are normal, no masses, organomegaly, or guarding present.  Neurological: A&O x3, Strenght is normal and symmetric bilaterally, cranial nerve II-XII are grossly intact, no focal motor deficit, sensory intact to light touch bilaterally.  Extremities : No Cyanosis, Clubbing or Edema  Labs on Admission:  Basic Metabolic Panel:  Recent Labs Lab 01/03/14 0427 01/04/14 0820 01/05/14 0500 01/06/14 0601 01/07/14 2301  NA 148* 145 141 142 141  K 4.4 4.7 5.0 4.2 3.7  CL 104 105 103 104 99  CO2 32 32 GLUCOSE 186* 98 129* 102* 113*  BUN 28* 30* 28* 26* 26*  CREATININE 0.83 0.79 0.71 0.67 0.66  CALCIUM 8.8 8.9 8.7 8.7 9.0   Liver Function Tests:  Recent Labs Lab 01/03/14 0427 01/04/14 0820 01/05/14 0500 01/06/14 0601  AST ALT ALKPHOS 68 54 56 50  BILITOT <0.2* 0.2* 0.2* 0.3  PROT 6.3 5.8* 6.1 5.6*  ALBUMIN 3.7 3.3* 3.4* 3.1*   No results found for this basename: LIPASE, AMYLASE,  in the last 168 hours No results found for this basename: AMMONIA,  in the last 168 hours CBC:  Recent Labs Lab 01/03/14 0427 01/04/14 0820 01/05/14 0500 01/06/14 0601 01/07/14 2301  WBC 12.6* 8.4 5.3 7.0 9.9  NEUTROABS 11.7* 6.2 4.6 5.3 7.7  HGB 14.1 12.1* 13.0 12.4* 15.3  HCT 43.5 37.7* 39.7 37.5* 45.7  MCV 92.4 92.2 90.6 88.7 88.9  PLT 141* 120* 122* 141* 147*   Cardiac Enzymes:  Recent Labs Lab 01/02/14 2337 01/03/14 0205 01/03/14 0809 01/03/14 1408 01/05/14 0500 01/07/14 2301  CKTOTAL  --   --   --   --  107  --   CKMB  --   --   --   --  5.3*  --   TROPONINI <0.30 <0.30 <0.30 <0.30  --  <0.30    BNP (last 3 results)  Recent Labs  01/02/14 2337  01/07/14 2326  PROBNP 231.3* 1182.0*   CBG:  Recent Labs Lab 01/05/14 1746 01/06/14 0511 01/06/14 0745 01/06/14 1151 01/06/14 1358  GLUCAP 139* 104* 94 155* 82    Radiological Exams on Admission: Ct Angio Chest W/cm &/or Wo Cm  01/08/2014   CLINICAL DATA:  COPD, CVA, shortness of breath  EXAM: CT ANGIOGRAPHY CHEST WITH CONTRAST  TECHNIQUE: Multidetector CT imaging of the chest was performed using the standard protocol during bolus administration of intravenous contrast. Multiplanar CT image reconstructions and MIPs were obtained to evaluate the vascular anatomy.  CONTRAST:  OMNIPAQUE IOHEXOL 350 MG/ML SOLN  COMPARISON:  None.  FINDINGS: There is adequate opacification of the pulmonary arteries. There is no pulmonary embolus. The main pulmonary artery, right main pulmonary artery and  left main pulmonary arteries are normal in size. The heart size is normal. There is no pericardial effusion.  There is bilateral centrilobular emphysema. There is lingular and left lower lobe reticular nodular interstitial disease which may be secondary to an infectious or inflammatory etiology including atypical infection.  There is no axillary, hilar, or mediastinal adenopathy.  There is no lytic or blastic osseous lesion. 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  Review of the MIP images confirms the above findings.  IMPRESSION: 1. No evidence of pulmonary embolus. 2. Lingular and left lower lobe reticular nodular interstitial disease concerning for an infectious or inflammatory etiology including atypical infection such as MAI.   Electronically Signed   By: Elige Ko   On: 01/08/2014 00:45   Dg Chest Portable 1 View  01/07/2014   CLINICAL DATA:  Shortness of breath, weakness, confusion.  EXAM: PORTABLE CHEST - 1 VIEW  COMPARISON:  01/02/2014  FINDINGS: Shallow inspiration with elevation of left hemidiaphragm. The heart  size and mediastinal contours are within normal limits. Both lungs are clear. Degenerative changes in the shoulders. Probable bilateral rotator cuff arthropathy.  IMPRESSION: No evidence of active pulmonary disease.   Electronically Signed   By: Burman Nieves M.D.   On: 01/07/2014 22:58    EKG: Independently reviewed.    Assessment/Plan Active Problems:   History of CVA (cerebrovascular accident)   Dysphagia, unspecified(787.20)   Hypoxia   Acute respiratory failure  Acute respiratory failure Patient has shortness of breath or cough. BNP is elevated to 1182, CK-MB shows inflammatory versus infectious etiology,? MAI. Patient has been started on IV Zosyn, will also give one dose of Lasix 40 mg IV x1 as he does have elevated BNP which is gone up from 231 on 01/02/2014.  COPD exacerbation Will continue his DuoNeb nebulizers every 6 hours when necessary and patient was discharged on prednisone taper. Will start back on prednisone 40 mg daily.  Dysphagia We'll keep the patient n.p.o., as per discharge summary patient did not want PEG tube  Code status: Patient is DO NOT RESUSCITATE  Family discussion: No family at bedside   Time Spent on Admission: 65 min  Godwin Tedesco S [facility] Pager: [phone] 01/08/2014, 2:12 AM  If 7PM-7AM, please contact night-coverage  [email or web address]  Password [email or web address]

## 2014-01-08 NOTE — Progress Notes (Signed)
INITIAL NUTRITION ASSESSMENT  DOCUMENTATION CODES Per approved criteria  -Not Applicable   INTERVENTION: Magic cup TID with meals, each supplement provides 290 kcal and 9 grams of protein   NUTRITION DIAGNOSIS: Inadequate oral intake related to  as evidenced by meal intake 0%  Goal: Pt will meet >90% of estimated nutritional needs  Monitor:   PO intake (meals and supplements), labs, weight changes, I/O's  Reason for Assessment: MST=2  58 y.o. 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  Pt weight hx shows 10% weight loss over the past month which is severe. He is non-verbal and has Dementia at baseline. Nutrition focused exam deferred at this time. Suspect pt has inadequate oral intake severe malnutrition will continue to follow as re-assess as indicated.   Labs reviewed.   Height: Ht Readings from Last 1 Encounters:  01/08/14  (1.651 m)    Weight: Wt Readings from Last 1 Encounters:  01/08/14 124 lb 9.6 oz (56.518 kg)    Ideal Body Weight: 136#  % Ideal Body Weight: 93%  Wt Readings from Last 10 Encounters:  01/08/14 124 lb 9.6 oz (56.518 kg)  01/06/14 126 lb (57.153 kg)  12/26/13 140 lb (63.504 kg)  12/10/13 133 lb 4.8 oz (60.464 kg)  05/03/13 141 lb (63.957 kg)    Usual Body Weight: 140#  % Usual Body Weight: 89%  BMI:  Body mass index is 20.73 kg/(m^2). Normal weight range   Estimated Nutritional Needs: Kcal: 1700-2000 (to prevent further weight loss) Protein: 70-80 gr Fluid: 1.7-2.0 L  Skin: stage 1 to ankle (9/22)  Diet Order: Dysphagia 1 with nectar-thick liquids  EDUCATION NEEDS: -Education not appropriate at this time   Intake/Output Summary (Last 24 hours) at 01/08/14 1303 Last data  filed at 01/08/14 0800  Gross per 24 hour  Intake      0 ml  Output    900 ml  Net   -900 ml    Last BM: PTA  Labs:   Recent Labs Lab 01/06/14 0601 01/07/14 2301 01/08/14 0720  NA 142 141 143  K 4.2 3.7 3.5*  CL 104 99 98  CO2 29 30 32  BUN 26* 26* 24*  CREATININE 0.67 0.66 0.77  CALCIUM 8.7 9.0 9.0  GLUCOSE 102* 113* 87    CBG (last 3)   Recent Labs  01/06/14 0745 01/06/14 1151 01/06/14 1358  GLUCAP 94 155* 82    Scheduled Meds: . amLODipine  5 mg Oral Daily  . aspirin EC  81 mg Oral Daily  . atorvastatin  20 mg Oral QPM  . baclofen  20 mg Oral BID  . budesonide  0.5 mg Nebulization BID  . enoxaparin (LOVENOX) injection  40 mg Subcutaneous Q24H  . levalbuterol  0.63 mg Nebulization TID  . lisinopril  20 mg Oral Daily  . mirtazapine  30 mg Oral QHS  . nicotine  21 mg Transdermal Daily  . Oxcarbazepine  300 mg Oral BID  . scopolamine  1 patch Transdermal Q72H    Continuous Infusions:    Past Medical History  Diagnosis Date  . Back pain   . Dementia   . Hyperlipidemia   . Gait disturbance   . Depressive disorder   . COPD (chronic obstructive pulmonary disease)   . Dysphagia   .  CVA (cerebral infarction)   . Stroke   . Hypertension     Past Surgical History  Procedure Laterality Date  . Cholecystectomy      Royann Shivers MS,RD,CSG,LDN Office: [phone] Pager: [phone]

## 2014-01-08 NOTE — Progress Notes (Signed)
NT attempted to feed patient lunch.  Patient sitting upright at 90 degree angle.  Patient began choking after one bite.  NT stopped trying to feed the patient.  Patient's medications not given this morning due to patient's choking.  Dr. Kerry Hough on unit and notified.

## 2014-01-09 DIAGNOSIS — R471 Dysarthria and anarthria: Secondary | ICD-10-CM

## 2014-01-09 DIAGNOSIS — J438 Other emphysema: Secondary | ICD-10-CM

## 2014-01-09 LAB — GLUCOSE, CAPILLARY: Glucose-Capillary: 196 mg/dL — ABNORMAL HIGH (ref 70–99)

## 2014-01-09 MED ORDER — KETOROLAC TROMETHAMINE 30 MG/ML IJ SOLN
30.0000 mg | Freq: Four times a day (QID) | INTRAMUSCULAR | Status: DC | PRN
  Administered 2014-01-09 – 2014-01-10 (×2): 30 mg via INTRAVENOUS
  Filled 2014-01-09 (×3): qty 1

## 2014-01-09 MED ORDER — MORPHINE SULFATE 2 MG/ML IJ SOLN
2.0000 mg | INTRAMUSCULAR | Status: DC | PRN
  Administered 2014-01-10: 2 mg via INTRAVENOUS
  Filled 2014-01-09: qty 1

## 2014-01-09 MED ORDER — FUROSEMIDE 10 MG/ML IJ SOLN
20.0000 mg | Freq: Once | INTRAMUSCULAR | Status: AC
  Administered 2014-01-10: 20 mg via INTRAVENOUS
  Filled 2014-01-09: qty 2

## 2014-01-09 NOTE — Progress Notes (Signed)
Nursing staff called due to patient c/o of SOB; patient was given a Duo Neb;saturation before HHN was 80% on 4 lpm [place]; patient c/o of SOB after HHN and was having difficultly coughing into [place]. Patient was NTS with 50% venti mask in place; only  a very small amount of thin white frothy secretion was removed. Patient is a DNR is still currently on 55% venti mask saturation 83%; pt isn't c/o of SOB at this time. Spoke to patient about wearing a BIPAP mask if saturations do not improve; pt is willing to try.

## 2014-01-09 NOTE — Progress Notes (Signed)
Pt typed on laptop pain in hip 10/10.  MD notified orders received.

## 2014-01-09 NOTE — Progress Notes (Signed)
[facility] PROGRESS NOTE  Jason Cisneros:[id number] DOB: 1956/04/05 DOA: 01/07/2014 PCP: Pearson Grippe, MD  Assessment/Plan: 1. Acute respiratory failure 2. COPD 3. Terminal dysphagia 4. 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 Severe dysarthria, patient is nonverbal 6. Hypertension 7. History of CVA  Discussion: Patient is known to me from prior admission. He has severe dysarthria and only is able to communicate via typing on his laptop. He's had prior strokes, but also likely has an underlying progressive neurologic condition. His guardian reports that he was walking/driving approximately 9 months ago. He's had progressive dysarthria, dysphagia, and has essentially become bed/wheelchair bound. He has been seen by neurology and felt to have upper motor neuron findings in both lower extremities and lower motor neuron findings in both upper extremities. With the progressive nature of his disease, it was felt that he may have an underlying condition such as ALS. We have discussed options such as PEG tube feeding and further goals of care. The patient has refused any PEG tube placement, which I think is very reasonable. At this point, he is unable to tolerate anything by mouth. He would be a good candidate for hospice. He is agreeable to return to the skilled nursing facility and have hospice services follow him care for end-of-life/comfort care.  Code Status: DNR, comfort measures Family Communication: discussed with patient, his gaurdian Radford Pax and his friend. Disposition Plan: return to [facility] facility with hospice services for comfort care.   Consultants:    Procedures:    Antibiotics:    HPI/Subjective: Patient did not appear to be in any distress. He continues to have significant coughing and choking, especially after trying G./drink anything.  Objective: Filed Vitals:   01/09/14 1424  BP: 150/73  Pulse: 74  Temp: 99 F (37.2 C)  Resp:  18   No intake or output data in the 24 hours ending 01/09/14 2006 Filed Weights   01/08/14 0242  Weight: 56.518 kg (124 lb 9.6 oz)    Exam:   General:  NAD  Cardiovascular: s1, s2 rrr  Respiratory: coarse breath sounds bilaterally  Abdomen: soft, nt, nd, bs+  Musculoskeletal: no edema b/l   Data Reviewed: Basic Metabolic Panel:  Recent Labs Lab 01/04/14 0820 01/05/14 0500 01/06/14 0601 01/07/14 2301 01/08/14 0720  NA 145 141 142 141 143  K 4.7 5.0 4.2 3.7 3.5*  CL 105 103 104 99 98  CO2 32 32  GLUCOSE 98 129* 102* 113* 87  BUN 30* 28* 26* 26* 24*  CREATININE 0.79 0.71 0.67 0.66 0.77  CALCIUM 8.9 8.7 8.7 9.0 9.0   Liver Function Tests:  Recent Labs Lab 01/03/14 0427 01/04/14 0820 01/05/14 0500 01/06/14 0601 01/08/14 0720  AST ALT 41  ALKPHOS 68 54 56 50 62  BILITOT <0.2* 0.2* 0.2* 0.3 0.4  PROT 6.3 5.8* 6.1 5.6* 6.6  ALBUMIN 3.7 3.3* 3.4* 3.1* 3.8   No results found for this basename: LIPASE, AMYLASE,  in the last 168 hours No results found for this basename: AMMONIA,  in the last 168 hours CBC:  Recent Labs Lab 01/03/14 0427 01/04/14 0820 01/05/14 0500 01/06/14 0601 01/07/14 2301 01/08/14 0720  WBC 12.6* 8.4 5.3 7.0 9.9 9.1  NEUTROABS 11.7* 6.2 4.6 5.3 7.7  --   HGB 14.1 12.1* 13.0 12.4* 15.3 15.3  HCT 43.5 37.7* 39.7 37.5* 45.7 46.6  MCV 92.4 92.2 90.6 88.7 88.9 90.1  PLT 141*  120* 122* 141* 147* 150   Cardiac Enzymes:  Recent Labs Lab 01/02/14 2337 01/03/14 0205 01/03/14 0809 01/03/14 1408 01/05/14 0500 01/07/14 2301  CKTOTAL  --   --   --   --  107  --   CKMB  --   --   --   --  5.3*  --   TROPONINI <0.30 <0.30 <0.30 <0.30  --  <0.30   BNP (last 3 results)  Recent Labs  01/02/14 2337 01/07/14 2326  PROBNP 231.3* 1182.0*   CBG:  Recent Labs Lab 01/05/14 2219 01/06/14 0511 01/06/14 0745 01/06/14 1151 01/06/14 1358  GLUCAP 196* 104* 94 155* 82    Recent Results (from the  past 240 hour(s))  CULTURE, BLOOD (ROUTINE X 2)     Status: None   Collection Time    01/03/14 12:16 AM      Result Value Ref Range Status   Specimen Description BLOOD LEFT FOREARM   Final   Special Requests     Final   Value: BOTTLES DRAWN AEROBIC AND ANAEROBIC AEB=8CC ANA=4CC   Culture NO GROWTH 5 DAYS   Final   Report Status 01/08/2014 FINAL   Final  CULTURE, BLOOD (ROUTINE X 2)     Status: None   Collection Time    01/03/14 12:16 AM      Result Value Ref Range Status   Specimen Description BLOOD LEFT HAND   Final   Special Requests     Final   Value: BOTTLES DRAWN AEROBIC AND ANAEROBIC AEB=8CC ANA=6CC   Culture NO GROWTH 5 DAYS   Final   Report Status 01/08/2014 FINAL   Final  URINE CULTURE     Status: None   Collection Time    01/03/14  3:00 AM      Result Value Ref Range Status   Specimen Description URINE, CLEAN CATCH   Final   Special Requests NONE   Final   Culture  Setup Time     Final   Value: 01/03/2014 10:30     Performed at [facility] Count     Final   Value: NO GROWTH     Performed at [facility]   Culture     Final   Value: NO GROWTH     Performed at [facility]   Report Status 01/04/2014 FINAL   Final  MRSA PCR SCREENING     Status: None   Collection Time    01/03/14  3:00 AM      Result Value Ref Range Status   MRSA by PCR NEGATIVE  NEGATIVE Final   Comment:            The GeneXpert MRSA Assay (FDA     approved for NASAL specimens     only), is one component of a     comprehensive MRSA colonization     surveillance program. It is not     intended to diagnose MRSA     infection nor to guide or     monitor treatment for     MRSA infections.  CULTURE, BLOOD (ROUTINE X 2)     Status: None   Collection Time    01/08/14  1:07 AM      Result Value Ref Range Status   Specimen Description Blood BLOOD RIGHT HAND   Final   Special Requests     Final   Value: BOTTLES DRAWN AEROBIC AND ANAEROBIC 8CC DRAWN BY RN  Culture NO GROWTH 1 DAY   Final   Report Status PENDING   Incomplete  CULTURE, BLOOD (ROUTINE X 2)     Status: None   Collection Time    01/08/14  1:15 AM      Result Value Ref Range Status   Specimen Description Blood BLOOD RIGHT ARM   Final   Special Requests     Final   Value: BOTTLES DRAWN AEROBIC AND ANAEROBIC 7CC DRAWN BY RN   Culture NO GROWTH 1 DAY   Final   Report Status PENDING   Incomplete     Studies: Ct Angio Chest W/cm &/or Wo Cm  01/08/2014   CLINICAL DATA:  COPD, CVA, shortness of breath  EXAM: CT ANGIOGRAPHY CHEST WITH CONTRAST  TECHNIQUE: Multidetector CT imaging of the chest was performed using the standard protocol during bolus administration of intravenous contrast. Multiplanar CT image reconstructions and MIPs were obtained to evaluate the vascular anatomy.  CONTRAST:  OMNIPAQUE IOHEXOL 350 MG/ML SOLN  COMPARISON:  None.  FINDINGS: There is adequate opacification of the pulmonary arteries. There is no pulmonary embolus. The main pulmonary artery, right main pulmonary artery and left main pulmonary arteries are normal in size. The heart size is normal. There is no pericardial effusion.  There is bilateral centrilobular emphysema. There is lingular and left lower lobe reticular nodular interstitial disease which may be secondary to an infectious or inflammatory etiology including atypical infection.  There is no axillary, hilar, or mediastinal adenopathy.  There is no lytic or blastic osseous lesion. 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  Review of the MIP images confirms the above findings.  IMPRESSION: 1. No evidence of pulmonary embolus. 2. Lingular and left lower lobe reticular nodular interstitial disease concerning for an infectious or inflammatory etiology including atypical infection such as MAI.   Electronically Signed   By: Elige Ko   On: 01/08/2014 00:45   Dg Chest  Portable 1 View  01/07/2014   CLINICAL DATA:  Shortness of breath, weakness, confusion.  EXAM: PORTABLE CHEST - 1 VIEW  COMPARISON:  01/02/2014  FINDINGS: Shallow inspiration with elevation of left hemidiaphragm. The heart size and mediastinal contours are within normal limits. Both lungs are clear. Degenerative changes in the shoulders. Probable bilateral rotator cuff arthropathy.  IMPRESSION: No evidence of active pulmonary disease.   Electronically Signed   By: Burman Nieves M.D.   On: 01/07/2014 22:58    Scheduled Meds: . baclofen  20 mg Oral BID  . budesonide  0.5 mg Nebulization BID  . levalbuterol  0.63 mg Nebulization TID  . nicotine  21 mg Transdermal Daily  . Oxcarbazepine  300 mg Oral BID  . scopolamine  1 patch Transdermal Q72H   Continuous Infusions:   Active Problems:   History of CVA (cerebrovascular accident)   Dysphagia, unspecified(787.20)   Hypoxia   Acute respiratory failure    Time spent:    [facility]  [facility] Pager [phone]. If 7PM-7AM, please contact night-coverage at [email or web address], password [facility] 01/09/2014, 8:06 PM  LOS: 2 days

## 2014-01-09 NOTE — Care Management Note (Signed)
    Page 1 of 1   01/09/2014     1:43:44 PM CARE MANAGEMENT NOTE 01/09/2014  Patient:  CORDNEY, BARSTOW A   Account Number:  [id number]  Date Initiated:  01/09/2014  Documentation initiated by:  Sharrie Rothman  Subjective/Objective Assessment:   Pt readmitted from [facility] with acute respiratory failure. Pt now candidate for [facility]. Pt does not want hospice home.     Action/Plan:   [facility] to come speak with pt and his guardian. Pt may discharge to [facility] with hospice or if pt changes his mind transfer to [facility].   Anticipated DC Date:  01/09/2014   Anticipated DC Plan:  HOSPICE MEDICAL FACILITY  In-house referral  Clinical Social Worker      DC Planning Services  CM consult      Choice offered to / List presented to:             Status of service:  In process, will continue to follow Medicare Important Message given?   (If response is "NO", the following Medicare IM given date fields will be blank) Date Medicare IM given:   Medicare IM given by:   Date Additional Medicare IM given:   Additional Medicare IM given by:    Discharge Disposition:    Per UR Regulation:    If discussed at Long Length of Stay Meetings, dates discussed:    Comments:  01/09/14 1120 Arlyss Queen, RN BSN CM

## 2014-01-09 NOTE — Clinical Social Work Psychosocial (Signed)
Clinical Social Work Department BRIEF PSYCHOSOCIAL ASSESSMENT 01/09/2014  Patient:  Jason Cisneros,Jason Cisneros     Account Number:  [id number]     Admit date:  01/07/2014  Clinical Social Worker:  ,, LCSW  Date/Time:  01/09/2014 02:56 PM  Referred by:  CSW  Date Referred:  01/09/2014 Referred for  SNF Placement  Residential hospice placement   Other Referral:   Interview type:  Patient Other interview type:   Faith- guardian    PSYCHOSOCIAL DATA Living Status:  FACILITY Admitted from facility:  [facility] Level of care:  Skilled Nursing Facility Primary support name:  Faith Primary support relationship to patient:  FRIEND Degree of support available:   very supportive    CURRENT CONCERNS Current Concerns  Post-Acute Placement   Other Concerns:    SOCIAL WORK ASSESSMENT / PLAN CSW met with pt and pt's guardian Faith, as well as pt's girlfriend Debbie at bedside. Pt known to CSW from admission last week and return to [facility] on Friday. 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 Pt has history of stroke and baseline aphasia. He communicates using [employer] laptop. Pt came back to ED on Saturday due to sats in the 50's. Plan was for hospice to follow pt at [facility] starting today. Pt has significant support from Faith and Debbie. He has Cisneros sister in [place] and Cisneros brother in [place]. However, he has not had contact with them in years. Pt told MD that his sister was planning on taking him home to care for him. Per guardian, this is not possible as she is disabled. Pt had agreed to [facility] over weekend, but today decided that he did not want to go there as his mother died [facility]. Hospice just met with pt, Faith, and Debbie. They have decided for pt to return to [facility] with hospice. Pt has become comfortable with staff there and is particularly close with one resident. When asked how they were coping with situation, pt became tearful  and Faith reports it has just been sad. Support provided. Per Debbie at [facility], okay for return with hospice. Anticipate d/c tomorrow.   Assessment/plan status:  Psychosocial Support/Ongoing Assessment of Needs Other assessment/ plan:   Information/referral to community resources:   Hospice  [facility]    PATIENT'S/FAMILY'S RESPONSE TO PLAN OF CARE: Pt, Faith, and Debbie are emotional regarding pt's poor prognosis. CSW discussed feelings and provided support. They have accepted hospice services at [facility] at d/c.        , LCSW [phone] 

## 2014-01-10 MED ORDER — LORAZEPAM 1 MG PO TABS
1.0000 mg | ORAL_TABLET | Freq: Four times a day (QID) | ORAL | Status: AC | PRN
Start: 2014-01-10 — End: ?

## 2014-01-10 MED ORDER — LORAZEPAM 2 MG/ML IJ SOLN
1.0000 mg | Freq: Once | INTRAMUSCULAR | Status: AC
  Administered 2014-01-10: 1 mg via INTRAVENOUS
  Filled 2014-01-10: qty 1

## 2014-01-10 MED ORDER — MORPHINE SULFATE (CONCENTRATE) 10 MG /0.5 ML PO SOLN: 10.0000 mg | ORAL | Status: AC | PRN

## 2014-01-10 NOTE — Progress Notes (Signed)
Pt anxious. Unable to cough or get much returned from coughing. MD and respiratory called. Given one time dose of 1 mg ativan IV per order.

## 2014-01-10 NOTE — Progress Notes (Signed)
After breathing treatments done, RT placed patient on 55% venturi mask for SATs 6870f 94%. RT will continue to monitor

## 2014-01-10 NOTE — Progress Notes (Signed)
Pt's O2 sat dropping in the 80's. Respiratory called.

## 2014-01-10 NOTE — Progress Notes (Signed)
Pt resting quietly this morning. No sign of distress.

## 2014-01-10 NOTE — Clinical Social Work Note (Signed)
Pt d/c today back to [facility] with hospice. Pt's guardian and facility aware and agreeable. [facility] aware of ventimask. Pt to transport via [facility]. D/C summary to be faxed upon completion.   Derenda FennelKara Cutler Sunday, [place] [phone]

## 2014-01-10 NOTE — Progress Notes (Signed)
Called report to De Sotokelly, Charity fundraiserN at [facility]. [facility].  All belongings sent with pt.

## 2014-01-10 NOTE — Progress Notes (Signed)
Pt given meds crushed in apple sauce, pt choked on apple sauce.  Pt mouth suctioned with yanker, pt asked if he could breathe pt shook his head and continued to have raspy breathing.  Dr Kerry HoughMemon called to notify, orders received for deep suctioning.  Pt suctioned and venti mask placed at [id number].  Pt SAT at 94%.  Pt given morphine 2mg  to help with respiratory distress.  Pt resting comfortably in bed.  Received order for NPO status due to loss of gag reflex intermittantly.  Pt to be transported to [facility] today with new orders.  Will continue to monitor.

## 2014-01-10 NOTE — Discharge Summary (Addendum)
Physician Discharge Summary  Jason Cisneros WUJ:[id number] DOB: 1955/09/29 DOA: 01/07/2014  PCP: Pearson Grippe, MD  Admit date: 01/07/2014 Discharge date: 01/10/2014  Time spent: 40 minutes  Recommendations for Outpatient Follow-up:  1. 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 Severe dysarthria, nonverbal Aspiration pneumonia  Discharge Condition: terminal  Diet recommendation: NPO, can use swabs to moisten mouth for comfort, if po diet is necessary would recommend puree diet with nectar thick liquids, although NPO would be safest since patient aspirates all po intake  Filed Weights   01/08/14 0242  Weight: 56.518 kg (124 lb 9.6 oz)    History of present illness:  58 year old male with a history of prior CVA, COPD, tobacco abuse, terminal dysphagia, possible ALS, and severe dysarthria who presents to the emergency room on 9/27 after being discharged from hospital on 9/25. Patient was known to have severe dysphagia to all solids and liquids. He was sent back to his nursing facility with plans for hospice to follow the patient there. After his discharge, he was noted to be significantly hypoxic, short of breath. He was brought to the emergency room for evaluation and felt to have possible new pneumonia. He was admitted for further treatments   Hospital Course:  Patient was monitored in the hospital. His respiratory status stabilized. His hypoxia was related to aspiration with a severe dysphagia. The patient continued to decline any PEG tube placement, which I think is very reasonable. After many discussions with the patient and his guardian, it was decided to pursue comfort care and hospice services.  Patient was seen by hospice in the hospital and has elected to return to the skilled nursing facility with hospice overseeing his care. His prognosis is certainly less than 6 weeks. He's currently on a Ventimask. I recommended that he stay n.p.o., since he begins coughing/aspirating with any kind of by mouth intake. He may receive mouth swabs to moisten his mouth for comfort. He will receive Roxanol respiratory distress/pain as well as Ativan for anxiety. The main focus of his treatment should be comfort at this point. He is stable at discharge back to skilled nursing facility   Procedures:    Consultations:    Discharge Exam: Filed Vitals:   01/09/14 2043  BP: 146/77  Pulse: 101  Temp: 98.5 F (36.9 C)  Resp: 20    General: NAD Cardiovascular: S1, S2 RRR Respiratory: coarse breath sounds bilaterally  Discharge Instructions You were cared for by a hospitalist during your hospital stay. If you have any questions about your discharge medications or the care you received while you were in the hospital after you are discharged, you can call the unit and asked to speak with the hospitalist on call if the hospitalist that took care of you is not available. Once you are discharged, your primary care physician will handle any further medical issues. Please note that NO REFILLS for any discharge medications will be authorized once you are discharged, as it is imperative that you return to your primary care physician (or establish a relationship with a primary care physician if you do not have one) for your aftercare needs so that they can reassess your need for medications and monitor your lab values.  Discharge Instructions  Diet - low sodium heart healthy    Complete by:  As directed      Increase activity slowly    Complete by:  As directed           Current Discharge Medication List    START taking these medications   Details  Morphine Sulfate (MORPHINE CONCENTRATE) 10 mg / 0.5 ml  concentrated solution Take 0.5 mLs (10 mg total) by mouth every 2 (two) hours as needed for severe pain or shortness of breath. Qty: 240 mL, Refills: 0      CONTINUE these medications which have CHANGED   Details  LORazepam (ATIVAN) 1 MG tablet Place 1 tablet (1 mg total) under the tongue every 6 (six) hours as needed for anxiety. Qty: 30 tablet, Refills: 0      CONTINUE these medications which have NOT CHANGED   Details  budesonide (PULMICORT) 0.5 MG/2ML nebulizer solution Take 0.5 mg by nebulization 2 (two) times daily.    ipratropium-albuterol (DUONEB) 0.5-2.5 (3) MG/3ML SOLN Take 3 mLs by nebulization every 6 (six) hours as needed (Shortness Of Breath).    levalbuterol (XOPENEX) 0.31 MG/3ML nebulizer solution Take 1 ampule by nebulization 3 (three) times daily.    nicotine (NICODERM CQ - DOSED IN MG/24 HOURS) 21 mg/24hr patch Place 21 mg onto the skin daily.    scopolamine (TRANSDERM-SCOP) 1 MG/3DAYS Place 1 patch onto the skin every 3 (three) days.      STOP taking these medications     amLODipine (NORVASC) 5 MG tablet      aspirin EC 81 MG tablet      atorvastatin (LIPITOR) 20 MG tablet      baclofen (LIORESAL) 20 MG tablet      cholecalciferol (VITAMIN D) 1000 UNITS tablet      lisinopril (PRINIVIL,ZESTRIL) 20 MG tablet      meloxicam (MOBIC) 15 MG tablet      mirtazapine (REMERON) 30 MG tablet      Oxcarbazepine (TRILEPTAL) 300 MG tablet      predniSONE (DELTASONE) 10 MG tablet        No Known Allergies    The results of significant diagnostics from this hospitalization (including imaging, microbiology, ancillary and laboratory) are listed below for reference.    Significant Diagnostic Studies: Ct Angio Chest W/cm &/or Wo Cm  01/08/2014   CLINICAL DATA:  COPD, CVA, shortness of breath  EXAM: CT ANGIOGRAPHY CHEST WITH CONTRAST  TECHNIQUE: Multidetector CT imaging of the chest was performed using the standard protocol during bolus administration of  intravenous contrast. Multiplanar CT image reconstructions and MIPs were obtained to evaluate the vascular anatomy.  CONTRAST:  OMNIPAQUE IOHEXOL 350 MG/ML SOLN  COMPARISON:  None.  FINDINGS: There is adequate opacification of the pulmonary arteries. There is no pulmonary embolus. The main pulmonary artery, right main pulmonary artery and left main pulmonary arteries are normal in size. The heart size is normal. There is no pericardial effusion.  There is bilateral centrilobular emphysema. There is lingular and left lower lobe reticular nodular interstitial disease which may be secondary to an infectious or inflammatory etiology including atypical infection.  There is no axillary, hilar, or mediastinal adenopathy.  There is no lytic or blastic osseous lesion. 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  Review of the MIP images confirms the above findings.  IMPRESSION: 1. No evidence of pulmonary embolus. 2. Lingular and left lower lobe reticular nodular interstitial  disease concerning for an infectious or inflammatory etiology including atypical infection such as MAI.   Electronically Signed   By: Elige Ko   On: 01/08/2014 00:45   Dg Chest Portable 1 View  01/07/2014   CLINICAL DATA:  Shortness of breath, weakness, confusion.  EXAM: PORTABLE CHEST - 1 VIEW  COMPARISON:  01/02/2014  FINDINGS: Shallow inspiration with elevation of left hemidiaphragm. The heart size and mediastinal contours are within normal limits. Both lungs are clear. Degenerative changes in the shoulders. Probable bilateral rotator cuff arthropathy.  IMPRESSION: No evidence of active pulmonary disease.   Electronically Signed   By: Burman Nieves M.D.   On: 01/07/2014 22:58   Dg Chest Portable 1 View  01/03/2014   CLINICAL DATA:  COPD, respiratory distress  EXAM: PORTABLE CHEST - 1 VIEW  COMPARISON:  08/03/2013  FINDINGS: The heart size and  mediastinal contours are within normal limits. Both lungs are clear. The visualized skeletal structures are unremarkable.  IMPRESSION: No active disease.   Electronically Signed   By: Esperanza Heir M.D.   On: 01/03/2014 00:33   Dg Swallowing Func-speech Pathology  01/04/2014   Dorene Ar, CCC-SLP     01/04/2014  3:08 PM Objective Swallowing Evaluation: Modified Barium Swallowing Study   Patient Details  Name: Jason Cisneros MRN: [id number] Date of Birth: 1955/08/25  Today's Date: 01/04/2014 Time: 1330-1400 SLP Time Calculation (min): 30 min  Past Medical History:  Past Medical History  Diagnosis Date  . Back pain   . Dementia   . Hyperlipidemia   . Gait disturbance   . Depressive disorder   . COPD (chronic obstructive pulmonary disease)   . Dysphagia   . CVA (cerebral infarction)   . Stroke   . Hypertension    Past Surgical History:  Past Surgical History  Procedure Laterality Date  . Cholecystectomy     HPI:  Mr. Rithik Odea is a 58 y.o. year old male with significant past  medical history of COPD, hypertension, CVA presenting with acute  respiratory failure with hypoxia and hypercarbia. Level V caveat  as patient is acutely in respiratory distress and is nonverbal at  baseline. Per report, patient with worsening respiratory status  over the past 24 hours at his skilled nursing facility. Was given  Solu-Medrol Xopenex at the living facility per her report with  minimal improvement in symptoms. Still smoking daily. Patient was  noted to be satting in the mid 70s on 10 L. He is a resident at  [facility]. Pt is currently NPO. SLP spoke with SLP at [facility] and  picked up his computer to facilitate communication. He reportedly  consumes puree diet with thin liquids, but always sounds  wet/congested. Pt has severe/profound dysarthria negatively  impacting expressive language skills. He uses a computer to  communicate and writing on paper.      Assessment / Plan / Recommendation Clinical Impression  Dysphagia  Diagnosis: Severe pharyngeal phase dysphagia Clinical impression: Mr. Porcaro presents with severe pharyngeal  phase dysphagia characterized by premature spillage to the  valleculae, decreased tongue base retraction, epiglottic  deflection, hyolaryngeal excursion, and CP relaxation resulting  in overall decreased pharyngeal pressure with stasis of bolus in  valleculae and pyriforms after the swallow and inability to  clear. Pt with severe difficulty passing puree through UES. He  was more successful in swallowing thin and nectars (through UES)  via sequential swallows, however he was also more likely to  aspirate the liquids. ~50% of the  liquid bolus passed through UES  while the remaining was retained in pharynx eventually leading to  penetration and aspiration. Pt was made efforts to cough to clear  aspirates, however was unsuccessful due to poor respiratory  effort. Pt is judged to be at high risk for aspiration of all  consistencies and textures with the majority of the bolus left in  pharynx and pt unable to clear. Recommend NPO with alternative  source of nutrition, long term. SLP called and spoke to his  friend, Gavin PoundDeborah (former fiance/girlfriend) who reports that pt  has resided at [facility] for ~6 months. She reports that he has been  quick to cry (labile) for years and stuttered slightly. He saw  Dr. Gerilyn Pilgrimoonquah in the recent past for gait instability/falls,  however it does not appear that he has had a brain MRI. Gavin PoundDeborah  reports that there has been a rapid decline in pt's ability to  speak and swallow in the last 3 months with significant weight  loss. She also reports that he was never hospitalized for swallow  dysfunction/stroke. Pt with severe/profound dysarthria and  dysphagia- query bulbar involvement. Recommendations discussed  with Dr. Kerry HoughMemon. Dr. Gerilyn Pilgrimoonquah has not seen the pt during this  admission and his input may be insightful given the reported  recent rapid decline in function. If pt does not want a  feeding  tube and opts for "safest diet" with the understanding that  aspiration is known and likely, recommend puree with NTL. SLP  will follow while in acute setting for goals of care (per MD).     Treatment Recommendation  Therapy as outlined in treatment plan below    Diet Recommendation NPO;Alternative means - long-term        Other  Recommendations Oral Care Recommendations: Oral care Q4  per protocol   Follow Up Recommendations  Skilled Nursing facility    Frequency and Duration min 2x/week  2 weeks       SLP Swallow Goals  Follow for goal of care tomorrow   General Date of Onset: 01/02/14 HPI: Mr. Gates RiggBrian Holtmeyer is a 58 y.o. year old male with significant  past medical history of COPD, hypertension, CVA presenting with  acute respiratory failure with hypoxia and hypercarbia. Level V  caveat as patient is acutely in respiratory distress and is  nonverbal at baseline. Per report, patient with worsening  respiratory status over the past 24 hours at his skilled nursing  facility. Was given Solu-Medrol Xopenex at the living facility  per her report with minimal improvement in symptoms. Still  smoking daily. Patient was noted to be satting in the mid 70s on  10 L. He is a resident at [facility]. Pt is currently NPO. SLP spoke  with SLP at [facility] and picked up his computer to facilitate  communication. He reportedly consumes puree diet with thin  liquids, but always sounds wet/congested. Pt has severe/profound  dysarthria negatively impacting expressive language skills. He  uses a computer to communicate and writing on paper.  Type of Study: Modified Barium Swallowing Study Reason for Referral: Objectively evaluate swallowing function Previous Swallow Assessment: BSE yesterday Diet Prior to this Study: NPO Temperature Spikes Noted: No Respiratory Status: Nasal cannula History of Recent Intubation: No Behavior/Cognition: Alert;Cooperative;Pleasant mood;Requires  cueing Oral Cavity - Dentition: Edentulous Oral Motor /  Sensory Function: Impaired - see Bedside swallow  eval Self-Feeding Abilities: Needs assist Patient Positioning: Upright in bed Baseline Vocal Quality: Wet Volitional Cough: Weak;Congested;Wet Volitional Swallow: Unable to elicit Anatomy:  Within functional limits Pharyngeal Secretions: Not observed secondary MBS    Reason for Referral Objectively evaluate swallowing function   Oral Phase Oral Preparation/Oral Phase Oral Phase: Impaired Oral - Nectar Oral - Nectar Cup: Weak lingual manipulation Oral - Thin Oral - Thin Teaspoon: Weak lingual manipulation Oral - Solids Oral - Puree: Weak lingual manipulation   Pharyngeal Phase Pharyngeal Phase Pharyngeal Phase: Impaired Pharyngeal - Nectar Pharyngeal - Nectar Cup: Premature spillage to valleculae;Reduced  pharyngeal peristalsis;Reduced epiglottic inversion;Reduced  anterior laryngeal mobility;Reduced laryngeal elevation;Reduced  airway/laryngeal closure;Reduced tongue base  retraction;Penetration/Aspiration during  swallow;Penetration/Aspiration after swallow;Trace  aspiration;Moderate aspiration;Pharyngeal residue -  valleculae;Pharyngeal residue - pyriform sinuses Penetration/Aspiration details (nectar cup): Material enters  airway, passes BELOW cords and not ejected out despite cough  attempt by patient;Material enters airway, passes BELOW cords  without attempt by patient to eject out (silent  aspiration);Material enters airway, passes BELOW cords then  ejected out Pharyngeal - Thin Pharyngeal - Thin Teaspoon: Premature spillage to  valleculae;Reduced epiglottic inversion;Reduced anterior  laryngeal mobility;Reduced laryngeal elevation;Reduced  airway/laryngeal closure;Reduced tongue base  retraction;Penetration/Aspiration before swallow;Pharyngeal  residue - pyriform sinuses;Pharyngeal residue - valleculae Penetration/Aspiration details (thin teaspoon): Material enters  airway, remains ABOVE vocal cords and not ejected out;Material  enters airway, remains ABOVE  vocal cords then ejected out Pharyngeal - Thin Cup: Premature spillage to valleculae;Reduced  pharyngeal peristalsis;Reduced epiglottic inversion;Reduced  anterior laryngeal mobility;Reduced laryngeal elevation;Reduced  airway/laryngeal closure;Reduced tongue base  retraction;Penetration/Aspiration during  swallow;Penetration/Aspiration after swallow;Trace  aspiration;Moderate aspiration;Pharyngeal residue -  valleculae;Pharyngeal residue - pyriform sinuses;Lateral channel  residue Penetration/Aspiration details (thin cup): Material enters  airway, passes BELOW cords without attempt by patient to eject  out (silent aspiration);Material enters airway, passes BELOW  cords and not ejected out despite cough attempt by  patient;Material enters airway, passes BELOW cords then ejected  out;Material enters airway, CONTACTS cords and not ejected out Pharyngeal - Thin Straw: Premature spillage to valleculae;Reduced  pharyngeal peristalsis;Reduced epiglottic inversion;Reduced  anterior laryngeal mobility;Reduced laryngeal elevation;Reduced  airway/laryngeal closure;Reduced tongue base  retraction;Penetration/Aspiration during  swallow;Penetration/Aspiration after swallow;Trace  aspiration;Moderate aspiration;Pharyngeal residue -  valleculae;Pharyngeal residue - pyriform sinuses;Lateral channel  residue Penetration/Aspiration details (thin straw): Material enters  airway, passes BELOW cords without attempt by patient to eject  out (silent aspiration);Material enters airway, passes BELOW  cords and not ejected out despite cough attempt by  patient;Material enters airway, passes BELOW cords then ejected  out;Material enters airway, CONTACTS cords and not ejected out Pharyngeal - Solids Pharyngeal - Puree: Delayed swallow initiation;Premature spillage  to valleculae;Reduced epiglottic inversion;Reduced anterior  laryngeal mobility;Reduced laryngeal elevation;Reduced pharyngeal  peristalsis;Penetration/Aspiration after  swallow;Pharyngeal  residue - valleculae;Pharyngeal residue - pyriform sinuses Penetration/Aspiration details (puree): Material enters airway,  remains ABOVE vocal cords and not ejected out  Cervical Esophageal Phase       Cervical Esophageal Phase Cervical Esophageal Phase: Impaired Cervical Esophageal Phase - Solids Puree: Reduced cricopharyngeal relaxation        Thank you,  Havery Moros, CCC-SLP [phone]  PORTER,DABNEY 01/04/2014, 3:07 PM     Microbiology: Recent Results (from the past 240 hour(s))  CULTURE, BLOOD (ROUTINE X 2)     Status: None   Collection Time    01/03/14 12:16 AM      Result Value Ref Range Status   Specimen Description BLOOD LEFT FOREARM   Final   Special Requests     Final   Value: BOTTLES DRAWN AEROBIC AND ANAEROBIC AEB=8CC ANA=4CC   Culture NO GROWTH 5 DAYS   Final  Report Status 01/08/2014 FINAL   Final  CULTURE, BLOOD (ROUTINE X 2)     Status: None   Collection Time    01/03/14 12:16 AM      Result Value Ref Range Status   Specimen Description BLOOD LEFT HAND   Final   Special Requests     Final   Value: BOTTLES DRAWN AEROBIC AND ANAEROBIC AEB=8CC ANA=6CC   Culture NO GROWTH 5 DAYS   Final   Report Status 01/08/2014 FINAL   Final  URINE CULTURE     Status: None   Collection Time    01/03/14  3:00 AM      Result Value Ref Range Status   Specimen Description URINE, CLEAN CATCH   Final   Special Requests NONE   Final   Culture  Setup Time     Final   Value: 01/03/2014 10:30     Performed at [facility] Count     Final   Value: NO GROWTH     Performed at [facility]   Culture     Final   Value: NO GROWTH     Performed at [facility]   Report Status 01/04/2014 FINAL   Final  MRSA PCR SCREENING     Status: None   Collection Time    01/03/14  3:00 AM      Result Value Ref Range Status   MRSA by PCR NEGATIVE  NEGATIVE Final   Comment:            The GeneXpert MRSA Assay (FDA     approved for NASAL specimens      only), is one component of a     comprehensive MRSA colonization     surveillance program. It is not     intended to diagnose MRSA     infection nor to guide or     monitor treatment for     MRSA infections.  CULTURE, BLOOD (ROUTINE X 2)     Status: None   Collection Time    01/08/14  1:07 AM      Result Value Ref Range Status   Specimen Description Blood BLOOD RIGHT HAND   Final   Special Requests     Final   Value: BOTTLES DRAWN AEROBIC AND ANAEROBIC 8CC DRAWN BY RN   Culture NO GROWTH 1 DAY   Final   Report Status PENDING   Incomplete  CULTURE, BLOOD (ROUTINE X 2)     Status: None   Collection Time    01/08/14  1:15 AM      Result Value Ref Range Status   Specimen Description Blood BLOOD RIGHT ARM   Final   Special Requests     Final   Value: BOTTLES DRAWN AEROBIC AND ANAEROBIC 7CC DRAWN BY RN   Culture NO GROWTH 1 DAY   Final   Report Status PENDING   Incomplete     Labs: Basic Metabolic Panel:  Recent Labs Lab 01/04/14 0820 01/05/14 0500 01/06/14 0601 01/07/14 2301 01/08/14 0720  NA 145 141 142 141 143  K 4.7 5.0 4.2 3.7 3.5*  CL 105 103 104 99 98  CO2 32 31 29 30  32  GLUCOSE 98 129* 102* 113* 87  BUN 30* 28* 26* 26* 24*  CREATININE 0.79 0.71 0.67 0.66 0.77  CALCIUM 8.9 8.7 8.7 9.0 9.0   Liver Function Tests:  Recent Labs Lab 01/04/14 0820 01/05/14 0500 01/06/14 0601 01/08/14 0720  AST 16 16 22 25   ALT 21 19 26  41  ALKPHOS 54 56 50 62  BILITOT 0.2* 0.2* 0.3 0.4  PROT 5.8* 6.1 5.6* 6.6  ALBUMIN 3.3* 3.4* 3.1* 3.8   No results found for this basename: LIPASE, AMYLASE,  in the last 168 hours No results found for this basename: AMMONIA,  in the last 168 hours CBC:  Recent Labs Lab 01/04/14 0820 01/05/14 0500 01/06/14 0601 01/07/14 2301 01/08/14 0720  WBC 8.4 5.3 7.0 9.9 9.1  NEUTROABS 6.2 4.6 5.3 7.7  --   HGB 12.1* 13.0 12.4* 15.3 15.3  HCT 37.7* 39.7 37.5* 45.7 46.6  MCV 92.2 90.6 88.7 88.9 90.1  PLT 120* 122* 141* 147* 150    Cardiac Enzymes:  Recent Labs Lab 01/03/14 1408 01/05/14 0500 01/07/14 2301  CKTOTAL  --  107  --   CKMB  --  5.3*  --   TROPONINI <0.30  --  <0.30   BNP: BNP (last 3 results)  Recent Labs  01/02/14 2337 01/07/14 2326  PROBNP 231.3* 1182.0*   CBG:  Recent Labs Lab 01/05/14 2219 01/06/14 0511 01/06/14 0745 01/06/14 1151 01/06/14 1358  GLUCAP 196* 104* 94 155* 82       Signed:  MEMON,JEHANZEB  [facility] 01/10/2014, 1:46 PM

## 2014-01-10 NOTE — Progress Notes (Signed)
Pt has not been able to communicate via his laptop this shift. He tolerated medicines well crushed in apple sauce with thicken water following, sitting up 90 degrees.  Will continue to monitor.

## 2014-01-12 DEATH — deceased

## 2014-01-13 LAB — MYASTHENIA GRAVIS PANEL 2
Acety choline binding ab: <0.3 nmol/L
Acetylchol Block Ab: <15 % of inhibition (ref <15)
Acetylcholine Modulat Ab: 7 %

## 2014-01-15 LAB — CULTURE, BLOOD (ROUTINE X 2)
Culture: NO GROWTH
Culture: NO GROWTH

## 2016-04-11 IMAGING — CT CT MAXILLOFACIAL W/O CM
3 series · 16 of 47 positions shown, 19 images · non-contrast
Comparison: 08/02/2013 CT head

CLINICAL DATA: Fall, no loss of consciousness, RIGHT temporal
laceration, history COPD, stroke, dementia

EXAM:
CT MAXILLOFACIAL WITHOUT CONTRAST
TECHNIQUE: Multidetector CT imaging of the maxillofacial structures was
performed. Multiplanar CT image reconstructions were also generated.
A small metallic BB was placed on the right temple in order to
reliably differentiate right from left.

[Series 2: max soft 2.0 h31s · axial · 0.48mm/px · z∈[+826,+982]mm · 10 of 122 slices shown, 13 images]
[im 9/122  brain]
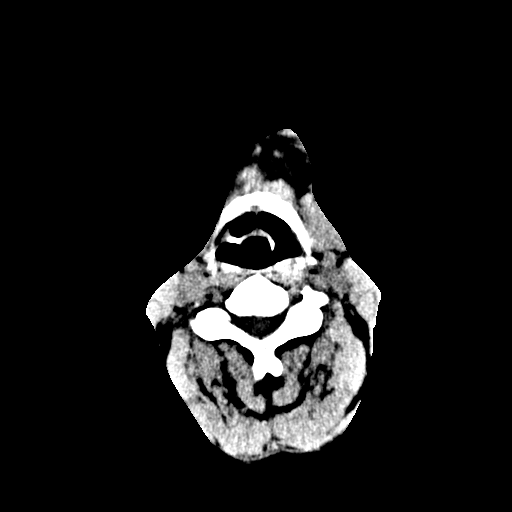
[im 9/122  bone]
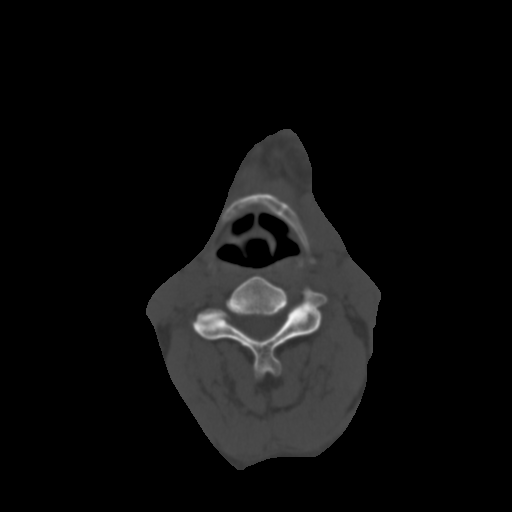
[im 21/122  bone]
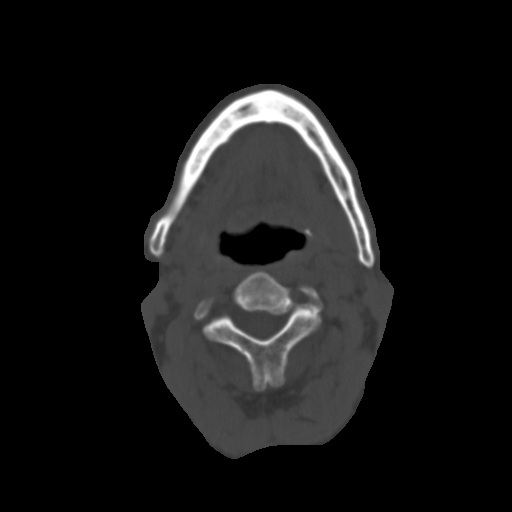
[im 34/122  bone]
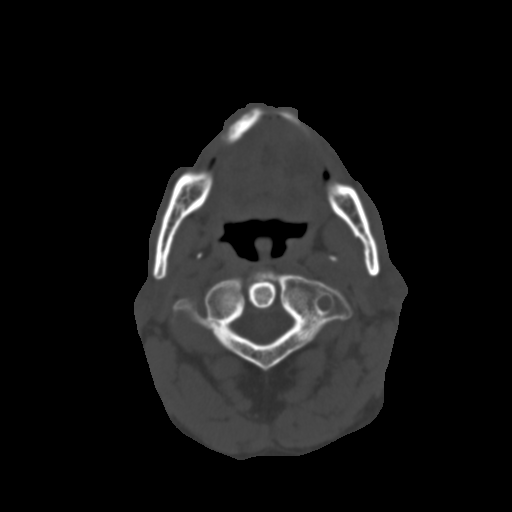
[im 42/122  bone]
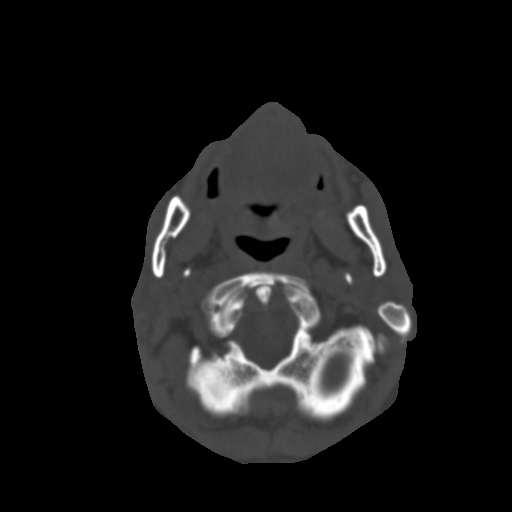
[im 55/122  brain]
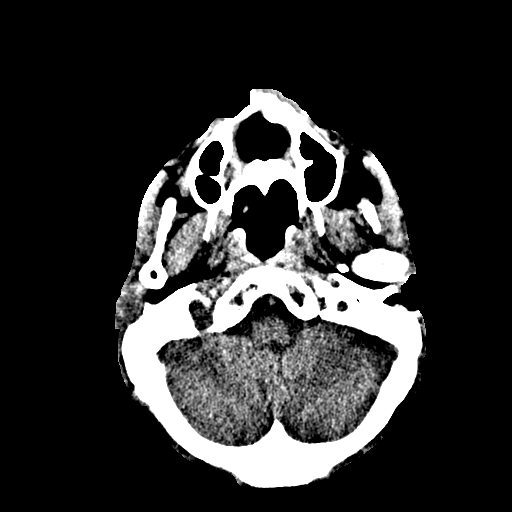
[im 55/122  bone]
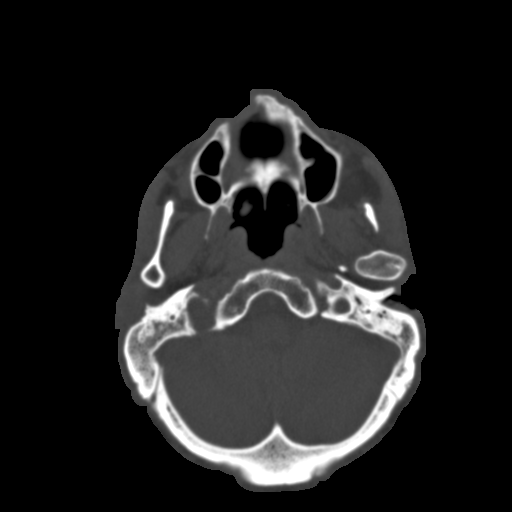
[im 67/122  bone]
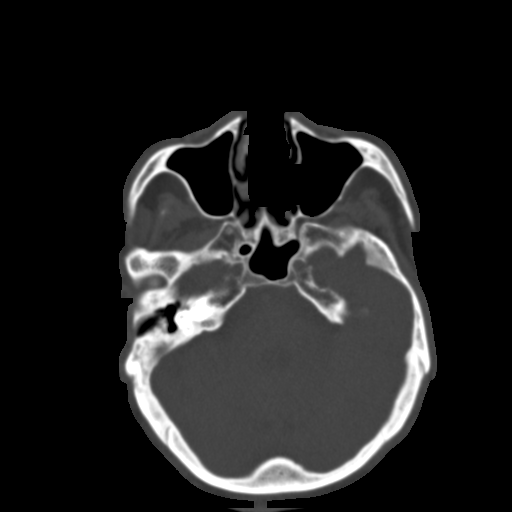
[im 80/122  bone]
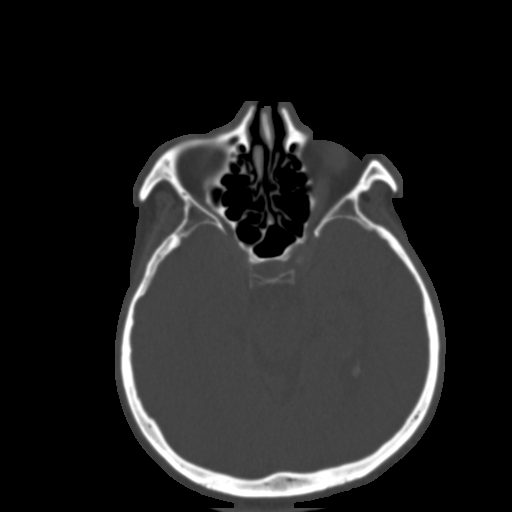
[im 92/122  bone]
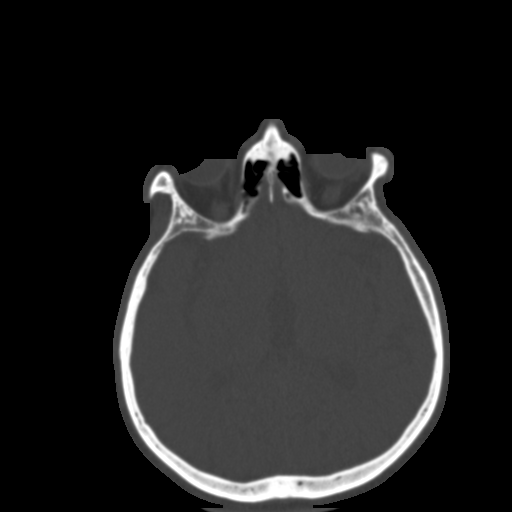
[im 101/122  brain]
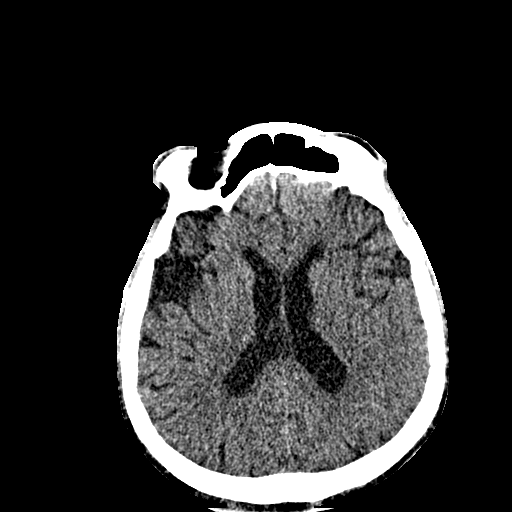
[im 101/122  bone]
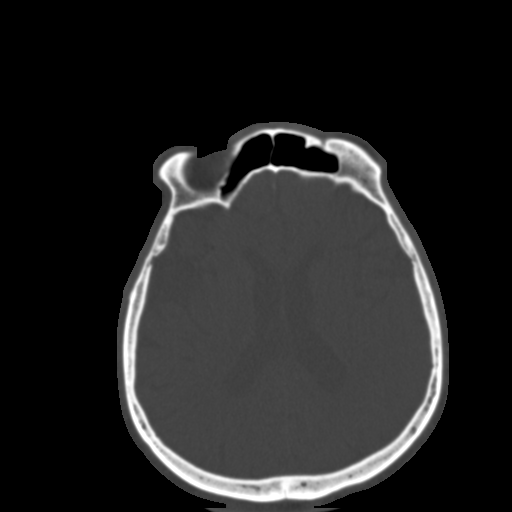
[im 113/122  bone]
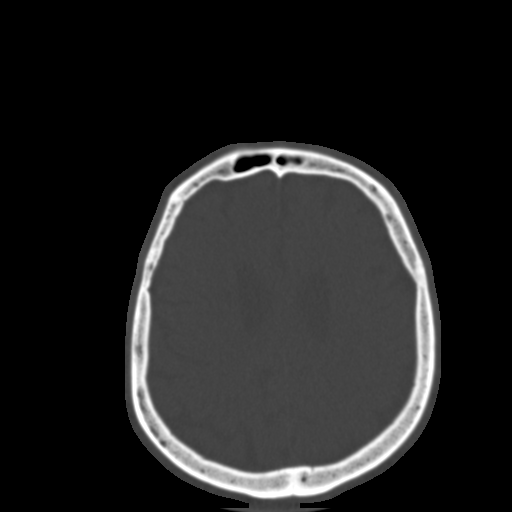

[Series 4: max st coronal · coronal · 0.37mm/px · 3 of 90 slices shown]
[im 30/90  bone]
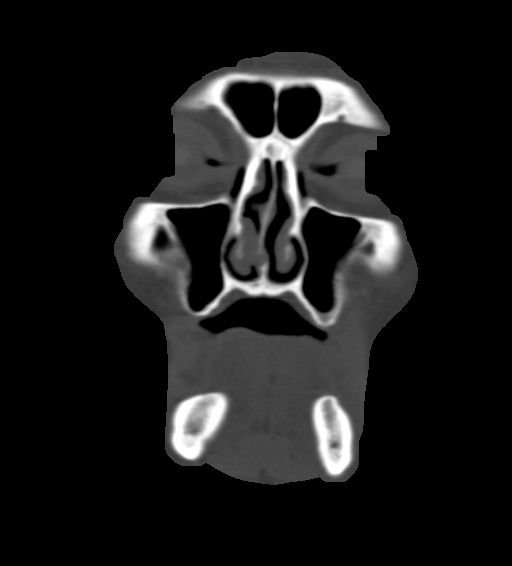
[im 40/90  bone]
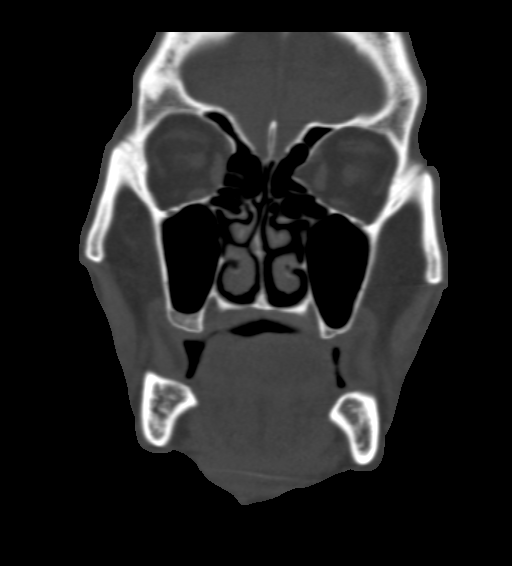
[im 50/90  bone]
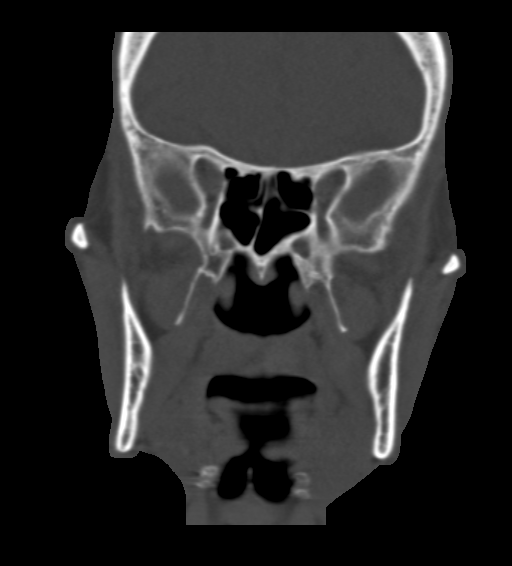

[Series 5: max st sagittal · sagittal · 0.42mm/px · 3 of 88 slices shown]
[im 30/88  bone]
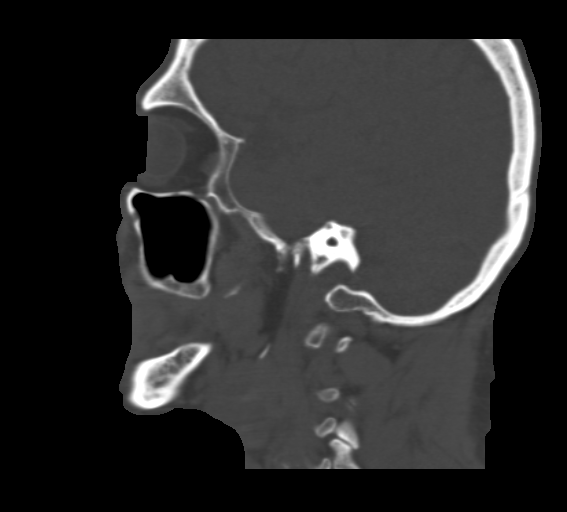
[im 44/88  bone]
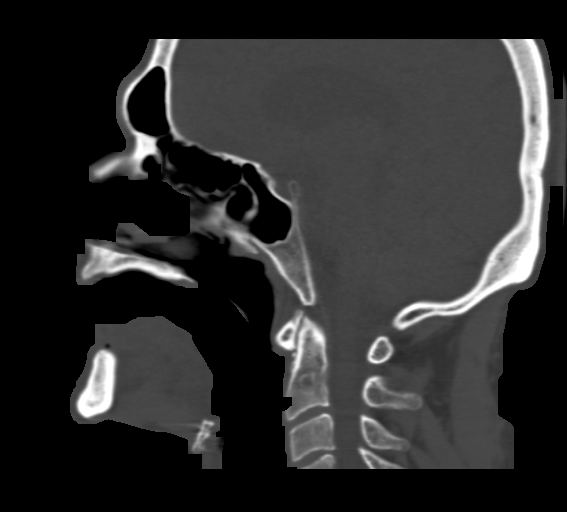
[im 59/88  bone]
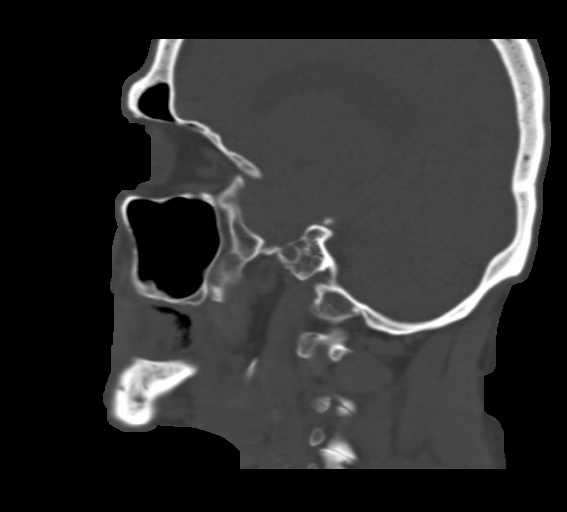

[16 of 47 positions shown; findings below may reference images not displayed]

FINDINGS: Generalized atrophy.

Normal ventricular morphology.

No midline shift or mass effect.

Small old high RIGHT parietal infarct.

Scattered atherosclerotic calcifications.

Prior RIGHT mastoid surgery.

LEFT middle ear cavity clear.

Remaining paranasal sinuses clear.

Nasal septal deviation to the RIGHT.

No facial bone fractures identified.

Intraorbital soft tissue planes clear.
IMPRESSION: No facial bone fractures identified.

Small old RIGHT parietal infarct.
